# Patient Record
Sex: Male | Born: 1985 | Race: White | Hispanic: No | Marital: Married | Smoking: Never smoker
Health system: Southern US, Community
[De-identification: ages and names within clinical notes are randomized; demographics above are authoritative.]

## PROBLEM LIST (undated history)

## (undated) DIAGNOSIS — Z789 Other specified health status: Secondary | ICD-10-CM

## (undated) HISTORY — PX: WISDOM TOOTH EXTRACTION: SHX21

## (undated) HISTORY — DX: Other specified health status: Z78.9

---
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
## 2008-07-23 ENCOUNTER — Emergency Department (HOSPITAL_BASED_OUTPATIENT_CLINIC_OR_DEPARTMENT_OTHER): Admission: EM | Admit: 2008-07-23 | Discharge: 2008-07-23 | Payer: Self-pay | Admitting: Emergency Medicine

## 2009-03-28 ENCOUNTER — Emergency Department (HOSPITAL_COMMUNITY): Admission: EM | Admit: 2009-03-28 | Discharge: 2009-03-28 | Payer: Self-pay | Admitting: Emergency Medicine

## 2010-05-11 LAB — CBC
HCT: 41.4 % (ref 39.0–52.0)
Hemoglobin: 14.4 g/dL (ref 13.0–17.0)
MCHC: 34.7 g/dL (ref 30.0–36.0)
MCV: 90.2 fL (ref 78.0–100.0)
Platelets: 205 10*3/uL (ref 150–400)
RBC: 4.6 MIL/uL (ref 4.22–5.81)
RDW: 12.2 % (ref 11.5–15.5)
WBC: 10.4 10*3/uL (ref 4.0–10.5)

## 2010-05-11 LAB — URINALYSIS, ROUTINE W REFLEX MICROSCOPIC
Bilirubin Urine: NEGATIVE
Glucose, UA: NEGATIVE mg/dL
Hgb urine dipstick: NEGATIVE
Ketones, ur: NEGATIVE mg/dL
Nitrite: NEGATIVE
Protein, ur: NEGATIVE mg/dL
Specific Gravity, Urine: >1.03 — ABNORMAL HIGH (ref 1.005–1.030)
Urobilinogen, UA: 0.2 mg/dL (ref 0.0–1.0)
pH: 5 (ref 5.0–8.0)

## 2010-05-11 LAB — BASIC METABOLIC PANEL
BUN: 12 mg/dL (ref 6–23)
CO2: 31 mEq/L (ref 19–32)
Calcium: 9.2 mg/dL (ref 8.4–10.5)
Chloride: 99 mEq/L (ref 96–112)
Creatinine, Ser: 0.9 mg/dL (ref 0.4–1.5)
GFR calc Af Amer: >60 mL/min (ref >60)
GFR calc non Af Amer: >60 mL/min (ref >60)
Glucose, Bld: 112 mg/dL — ABNORMAL HIGH (ref 70–99)
Potassium: 3.6 mEq/L (ref 3.5–5.1)
Sodium: 135 mEq/L (ref 135–145)

## 2010-05-11 LAB — HEPATIC FUNCTION PANEL
AST: 21 U/L (ref 0–37)
Albumin: 4.2 g/dL (ref 3.5–5.2)
Total Bilirubin: 0.9 mg/dL (ref 0.3–1.2)
Total Protein: 7.2 g/dL (ref 6.0–8.3)

## 2010-05-11 LAB — DIFFERENTIAL
Basophils Absolute: 0 10*3/uL (ref 0.0–0.1)
Basophils Relative: 0 % (ref 0–1)
Monocytes Relative: 7 % (ref 3–12)
Neutro Abs: 7.9 10*3/uL — ABNORMAL HIGH (ref 1.7–7.7)
Neutrophils Relative %: 75 % (ref 43–77)

## 2010-05-29 LAB — WOUND CULTURE

## 2012-11-01 ENCOUNTER — Emergency Department (HOSPITAL_BASED_OUTPATIENT_CLINIC_OR_DEPARTMENT_OTHER)
Admission: EM | Admit: 2012-11-01 | Discharge: 2012-11-02 | Disposition: A | Discharge status: Home / Self Care | Payer: BC Managed Care – PPO | Attending: Emergency Medicine | Admitting: Emergency Medicine

## 2012-11-01 ENCOUNTER — Encounter (HOSPITAL_BASED_OUTPATIENT_CLINIC_OR_DEPARTMENT_OTHER): Payer: Self-pay | Admitting: Emergency Medicine

## 2012-11-01 DIAGNOSIS — J9811 Atelectasis: Secondary | ICD-10-CM

## 2012-11-01 DIAGNOSIS — M549 Dorsalgia, unspecified: Secondary | ICD-10-CM | POA: Insufficient documentation

## 2012-11-01 DIAGNOSIS — R091 Pleurisy: Secondary | ICD-10-CM

## 2012-11-01 DIAGNOSIS — J9819 Other pulmonary collapse: Secondary | ICD-10-CM | POA: Insufficient documentation

## 2012-11-01 NOTE — ED Notes (Signed)
Pt denies injury or event but has had L rib area pain x 1 week that has increased over the last 24 hrs pain increases with deep breathing or palpation denies SOB

## 2012-11-01 NOTE — ED Provider Notes (Addendum)
CSN: [id number]     Arrival date & time 11/01/12  2146 History   This chart was scribed for Loren Racer, MD by Ronal Fear, ED Scribe. This patient was seen in room MH08/MH08 and the patient's care was started at 11:54 PM.   Chief Complaint  Patient presents with  . Chest Pain   Patient is a 27 y.o. male presenting with chest pain. The history is provided by the patient. No language interpreter was used.  Chest Pain Associated symptoms: back pain   Associated symptoms: no abdominal pain, no cough, no dizziness, no fever, no headache, no nausea, no numbness, no palpitations, no shortness of breath, not vomiting and no weakness    this is a 26 year old male complaining of left lateral chest pain. The pain has been there for roughly one week but worse over the last 24 hours. Patient states the pain is worse with deep breathing. He is able to rotate the pain with palpation. Has had no injury to the area. He has no rashes site. He denies shortness of breath or cough. No sick contacts. No recent extended travel or surgeries. He denies lower studies pain or swelling. Pain radiates around into his back.  No past medical history on file. No past surgical history on file. No family history on file. History  Substance Use Topics  . Smoking status: Never Smoker   . Smokeless tobacco: Not on file  . Alcohol Use: No    Review of Systems  Constitutional: Negative for fever and chills.  HENT: Negative for sore throat.   Respiratory: Negative for cough and shortness of breath.   Cardiovascular: Positive for chest pain. Negative for palpitations and leg swelling.  Gastrointestinal: Negative for nausea, vomiting, abdominal pain and diarrhea.  Musculoskeletal: Positive for back pain. Negative for myalgias.  Skin: Negative for rash.  Neurological: Negative for dizziness, seizures, weakness, light-headedness, numbness and headaches.  All other systems reviewed and are negative.    Allergies   Penicillins  Home Medications  No current outpatient prescriptions on file. BP 155/90  Pulse 96  Temp(Src) 99.5 F (37.5 C) (Oral)  Resp 18  SpO2 97% Physical Exam  Nursing note and vitals reviewed. Constitutional: He is oriented to person, place, and time. He appears well-developed and well-nourished. No distress.  HENT:  Head: Normocephalic and atraumatic.  Mouth/Throat: Oropharynx is clear and moist.  Eyes: EOM are normal. Pupils are equal, round, and reactive to light.  Neck: Normal range of motion. Neck supple. No tracheal deviation present.  Cardiovascular: Normal rate and regular rhythm.  Exam reveals friction rub. Exam reveals no gallop.   No murmur heard. Pulmonary/Chest: Effort normal. No respiratory distress. He has no wheezes. He has rales (crackles in the left base). He exhibits no tenderness.  Abdominal: Soft. Bowel sounds are normal. He exhibits no distension and no mass. There is no tenderness. There is no rebound and no guarding.  Musculoskeletal: Normal range of motion. He exhibits no edema and no tenderness.  No calf swelling or tenderness  Neurological: He is alert and oriented to person, place, and time.  Patient is alert and oriented x3 with clear, goal oriented speech. Patient has 5/5 motor in all extremities. Sensation is intact to light touch. Patient has a normal gait and walks without assistance.   Skin: Skin is warm and dry. No rash noted. No erythema.  Psychiatric: He has a normal mood and affect. His behavior is normal.    ED Course  Procedures (including critical  care time)  DIAGNOSTIC STUDIES: Oxygen Saturation is 97% on RA, adeqaute by my interpretation.    COORDINATION OF CARE: 11:54 PM- Pt advised of plan for treatment and pt agrees.     Labs Review Labs Reviewed - No data to display Imaging Review No results found.  MDM  Patient with pleuritic left lateral chest pain. Patient is a low risk for PE. We'll screen with d-dimer. Also  on the differential is pleurisy and subclinical pneumonia.  CT with evidence of left greater than right atelectasis. No evidence of PE or pneumonia. Patient was placed on ibuprofen 3 times daily with meals and regular insulin spirometry use. Return precautions have been given.  Loren Racer, MD 11/02/12 [phone]  I personally performed the services described in this documentation, which was scribed in my presence. The recorded information has been reviewed and is accurate.    Loren Racer, MD 11/02/12 [phone]

## 2012-11-02 ENCOUNTER — Emergency Department (HOSPITAL_BASED_OUTPATIENT_CLINIC_OR_DEPARTMENT_OTHER): Payer: BC Managed Care – PPO

## 2012-11-02 LAB — BASIC METABOLIC PANEL
BUN: 12 mg/dL (ref 6–23)
Calcium: 10 mg/dL (ref 8.4–10.5)
GFR calc Af Amer: >90 mL/min (ref >90)
GFR calc non Af Amer: >90 mL/min (ref >90)
Potassium: 3.8 mEq/L (ref 3.5–5.1)
Sodium: 140 mEq/L (ref 135–145)

## 2012-11-02 LAB — CBC WITH DIFFERENTIAL/PLATELET
Basophils Relative: 0 % (ref 0–1)
Eosinophils Absolute: 0.3 10*3/uL (ref 0.0–0.7)
Eosinophils Relative: 3 % (ref 0–5)
MCH: 31 pg (ref 26.0–34.0)
MCHC: 34.3 g/dL (ref 30.0–36.0)
Neutrophils Relative %: 61 % (ref 43–77)
Platelets: 220 10*3/uL (ref 150–400)

## 2012-11-02 MED ORDER — IOHEXOL 350 MG/ML SOLN
80.0000 mL | Freq: Once | INTRAVENOUS | Status: AC | PRN
  Administered 2012-11-02: 80 mL via INTRAVENOUS

## 2012-11-02 MED ORDER — IBUPROFEN 600 MG PO TABS: 600.0000 mg | ORAL_TABLET | Freq: Four times a day (QID) | ORAL | Status: DC | PRN

## 2015-02-16 ENCOUNTER — Ambulatory Visit (INDEPENDENT_AMBULATORY_CARE_PROVIDER_SITE_OTHER): Payer: 59 | Admitting: Family Medicine

## 2015-02-16 ENCOUNTER — Encounter: Payer: Self-pay | Admitting: Family Medicine

## 2015-02-16 VITALS — BP 144/94 | HR 113 | Temp 98.3°F | Ht 69.0 in | Wt 251.0 lb

## 2015-02-16 DIAGNOSIS — J209 Acute bronchitis, unspecified: Secondary | ICD-10-CM | POA: Diagnosis not present

## 2015-02-16 MED ORDER — AZITHROMYCIN 250 MG PO TABS: ORAL_TABLET | ORAL | Status: DC

## 2015-02-16 MED ORDER — FLUTICASONE PROPIONATE 50 MCG/ACT NA SUSP: 1.0000 | Freq: Two times a day (BID) | NASAL | Status: DC | PRN

## 2015-02-16 NOTE — Progress Notes (Signed)
BP 144/94 mmHg  Pulse 113  Temp(Src) 98.3 F (36.8 C) (Oral)  Ht  (1.753 m)  Wt 251 lb (113.853 kg)  BMI 37.05 kg/m2   Subjective:    Patient ID: Dale Wiley, male    DOB: 01/09/1986, 29 y.o.   MRN: [id number]  HPI: Dale Wiley is a 29 y.o. male presenting on 02/16/2015 for Chest congestion and Cough   HPI Chest congestion and cough  Patient presents today as a new patient because he has not been here in over 3 years clinic with chest congestion and cough. This is been going on for about 1 week. He's been using DayQuil and NyQuil and Sudafed and they helped some but not completely. He denies any fevers or chills but mainly just has this congestion down in his chest that he is having trouble clearing. He also complains of sinus congestion and postnasal drainage that is worse at night causing him to cough more at nighttime. His wife has been ill with pneumonia and he had a sister that had strep throat as well. He also complains of a sore in ulcer in the back of his throat and ears tonsils.  Relevant past medical, surgical, family and social history reviewed and updated as indicated. Interim medical history since our last visit reviewed. Allergies and medications reviewed and updated.  Review of Systems  Constitutional: Negative for fever and chills.  HENT: Positive for congestion, postnasal drip, rhinorrhea, sinus pressure and sore throat. Negative for ear discharge, ear pain, sneezing and voice change.   Eyes: Negative for pain, discharge, redness and visual disturbance.  Respiratory: Positive for cough. Negative for chest tightness, shortness of breath and wheezing.   Cardiovascular: Negative for chest pain and leg swelling.  Gastrointestinal: Negative for abdominal pain, diarrhea and constipation.  Genitourinary: Negative for difficulty urinating.  Musculoskeletal: Negative for back pain and gait problem.  Skin: Negative for rash.  Neurological: Negative for syncope,  light-headedness and headaches.  All other systems reviewed and are negative.   Per HPI unless specifically indicated above  Social History   Social History  . Marital Status: Married    Spouse Name: N/A  . Number of Children: N/A  . Years of Education: N/A   Occupational History  . Not on file.   Social History Main Topics  . Smoking status: Never Smoker   . Smokeless tobacco: Never Used  . Alcohol Use: Yes     Comment: occasionally  . Drug Use: No  . Sexual Activity: Yes     Comment: married since 2013   Other Topics Concern  . Not on file   Social History Narrative    Past Surgical History  Procedure Laterality Date  . Wisdom tooth extraction      Family History  Problem Relation Age of Onset  . Hypertension Mother   . Pancreatitis Mother   . Hypertension Father   . Hyperlipidemia Father   . Hypertension Maternal Grandfather       Medication List       This list is accurate as of: 02/16/15  4:25 PM.  Always use your most recent med list.               azithromycin 250 MG tablet  Commonly known as:  ZITHROMAX  Take 2 the first day and then one each day after.     DAYQUIL MULTI-SYMPTOM COLD/FLU PO  Take by mouth.     fluticasone 50 MCG/ACT nasal spray  Commonly known  as:  FLONASE  Place 1 spray into both nostrils 2 (two) times daily as needed for allergies or rhinitis.     NYQUIL MULTI-SYMPTOM PO  Take by mouth.           Objective:    BP 144/94 mmHg  Pulse 113  Temp(Src) 98.3 F (36.8 C) (Oral)  Ht 5\' 9"  (1.753 m)  Wt 251 lb (113.853 kg)  BMI 37.05 kg/m2  Wt Readings from Last 3 Encounters:  02/16/15 251 lb (113.853 kg)    Physical Exam  Constitutional: He is oriented to person, place, and time. He appears well-developed and well-nourished. No distress.  HENT:  Right Ear: Tympanic membrane, external ear and ear canal normal.  Left Ear: Tympanic membrane, external ear and ear canal normal.  Nose: Mucosal edema and rhinorrhea  present. No sinus tenderness. No epistaxis. Right sinus exhibits maxillary sinus tenderness. Right sinus exhibits no frontal sinus tenderness. Left sinus exhibits maxillary sinus tenderness. Left sinus exhibits no frontal sinus tenderness.  Mouth/Throat: Uvula is midline and mucous membranes are normal. Posterior oropharyngeal edema and posterior oropharyngeal erythema present. No oropharyngeal exudate or tonsillar abscesses.    Eyes: Conjunctivae and EOM are normal. Pupils are equal, round, and reactive to light. Right eye exhibits no discharge. No scleral icterus.  Cardiovascular: Normal rate, regular rhythm, normal heart sounds and intact distal pulses.   No murmur heard. Pulmonary/Chest: Effort normal and breath sounds normal. No respiratory distress. He has no wheezes.  Abdominal: He exhibits no distension.  Musculoskeletal: Normal range of motion. He exhibits no edema.  Neurological: He is alert and oriented to person, place, and time. Coordination normal.  Skin: Skin is warm and dry. No rash noted. He is not diaphoretic.  Psychiatric: He has a normal mood and affect. His behavior is normal.  Vitals reviewed.      Assessment & Plan:   Problem List Items Addressed This Visit    None    Visit Diagnoses    Acute bronchitis, unspecified organism    -  Primary    Relevant Medications    azithromycin (ZITHROMAX) 250 MG tablet    fluticasone (FLONASE) 50 MCG/ACT nasal spray        Follow up plan: Return in about 4 weeks (around 03/16/2015), or if symptoms worsen or fail to improve, for elevated BP.  Arville CareJoshua Londa Mackowski, MD [facility] 02/16/2015, 4:25 PM

## 2015-03-01 ENCOUNTER — Emergency Department (HOSPITAL_BASED_OUTPATIENT_CLINIC_OR_DEPARTMENT_OTHER)
Admission: EM | Admit: 2015-03-01 | Discharge: 2015-03-01 | Disposition: A | Discharge status: Home / Self Care | Payer: 59 | Attending: Emergency Medicine | Admitting: Emergency Medicine

## 2015-03-01 ENCOUNTER — Emergency Department (HOSPITAL_BASED_OUTPATIENT_CLINIC_OR_DEPARTMENT_OTHER): Payer: 59

## 2015-03-01 ENCOUNTER — Encounter (HOSPITAL_BASED_OUTPATIENT_CLINIC_OR_DEPARTMENT_OTHER): Payer: Self-pay | Admitting: *Deleted

## 2015-03-01 DIAGNOSIS — Z7951 Long term (current) use of inhaled steroids: Secondary | ICD-10-CM | POA: Insufficient documentation

## 2015-03-01 DIAGNOSIS — R42 Dizziness and giddiness: Secondary | ICD-10-CM | POA: Diagnosis not present

## 2015-03-01 DIAGNOSIS — R0602 Shortness of breath: Secondary | ICD-10-CM | POA: Insufficient documentation

## 2015-03-01 DIAGNOSIS — R0789 Other chest pain: Secondary | ICD-10-CM | POA: Insufficient documentation

## 2015-03-01 DIAGNOSIS — Z79899 Other long term (current) drug therapy: Secondary | ICD-10-CM | POA: Insufficient documentation

## 2015-03-01 DIAGNOSIS — Z880 Allergy status to penicillin: Secondary | ICD-10-CM | POA: Diagnosis not present

## 2015-03-01 DIAGNOSIS — R002 Palpitations: Secondary | ICD-10-CM | POA: Diagnosis present

## 2015-03-01 LAB — BASIC METABOLIC PANEL
ANION GAP: 6 (ref 5–15)
BUN: 12 mg/dL (ref 6–20)
CALCIUM: 9.3 mg/dL (ref 8.9–10.3)
CHLORIDE: 103 mmol/L (ref 101–111)
CO2: 27 mmol/L (ref 22–32)
CREATININE: 0.98 mg/dL (ref 0.61–1.24)
GFR calc non Af Amer: >60 mL/min (ref >60)
Glucose, Bld: 103 mg/dL — ABNORMAL HIGH (ref 65–99)
Potassium: 3.3 mmol/L — ABNORMAL LOW (ref 3.5–5.1)
SODIUM: 136 mmol/L (ref 135–145)

## 2015-03-01 LAB — CBC
HCT: 43.7 % (ref 39.0–52.0)
HEMOGLOBIN: 14.6 g/dL (ref 13.0–17.0)
MCH: 30.4 pg (ref 26.0–34.0)
MCHC: 33.4 g/dL (ref 30.0–36.0)
MCV: 90.9 fL (ref 78.0–100.0)
PLATELETS: 290 10*3/uL (ref 150–400)
RBC: 4.81 MIL/uL (ref 4.22–5.81)
RDW: 12.4 % (ref 11.5–15.5)
WBC: 7.3 10*3/uL (ref 4.0–10.5)

## 2015-03-01 LAB — TROPONIN I

## 2015-03-01 MED ORDER — ALBUTEROL SULFATE HFA 108 (90 BASE) MCG/ACT IN AERS: 2.0000 | INHALATION_SPRAY | RESPIRATORY_TRACT | Status: DC | PRN

## 2015-03-01 NOTE — ED Notes (Signed)
Patient transported to X-ray 

## 2015-03-01 NOTE — ED Notes (Signed)
Pt c/o " heart fluttering" and SOB  x 1 hr ago off and on x 8 months

## 2015-03-01 NOTE — Discharge Instructions (Signed)
You were evaluated in the ED today for your chest discomfort. There does not appear to be an emergent cause for your symptoms at this time. Your exam was reassuring as were your labs, EKG, chest x-ray. Please follow-up with your doctor in the next 2-3 days for reevaluation. He may use your prescribed inhaler to see if this helps with your discomfort. Return to ED for any new or worsening symptoms as we discussed.  Nonspecific Chest Pain  Chest pain can be caused by many different conditions. There is always a chance that your pain could be related to something serious, such as a heart attack or a blood clot in your lungs. Chest pain can also be caused by conditions that are not life-threatening. If you have chest pain, it is very important to follow up with your health care provider. CAUSES  Chest pain can be caused by:  Heartburn.  Pneumonia or bronchitis.  Anxiety or stress.  Inflammation around your heart (pericarditis) or lung (pleuritis or pleurisy).  A blood clot in your lung.  A collapsed lung (pneumothorax). It can develop suddenly on its own (spontaneous pneumothorax) or from trauma to the chest.  Shingles infection (varicella-zoster virus).  Heart attack.  Damage to the bones, muscles, and cartilage that make up your chest wall. This can include:  Bruised bones due to injury.  Strained muscles or cartilage due to frequent or repeated coughing or overwork.  Fracture to one or more ribs.  Sore cartilage due to inflammation (costochondritis). RISK FACTORS  Risk factors for chest pain may include:  Activities that increase your risk for trauma or injury to your chest.  Respiratory infections or conditions that cause frequent coughing.  Medical conditions or overeating that can cause heartburn.  Heart disease or family history of heart disease.  Conditions or health behaviors that increase your risk of developing a blood clot.  Having had chicken pox (varicella  zoster). SIGNS AND SYMPTOMS Chest pain can feel like:  Burning or tingling on the surface of your chest or deep in your chest.  Crushing, pressure, aching, or squeezing pain.  Dull or sharp pain that is worse when you move, cough, or take a deep breath.  Pain that is also felt in your back, neck, shoulder, or arm, or pain that spreads to any of these areas. Your chest pain may come and go, or it may stay constant. DIAGNOSIS Lab tests or other studies may be needed to find the cause of your pain. Your health care provider may have you take a test called an ambulatory ECG (electrocardiogram). An ECG records your heartbeat patterns at the time the test is performed. You may also have other tests, such as:  Transthoracic echocardiogram (TTE). During echocardiography, sound waves are used to create a picture of all of the heart structures and to look at how blood flows through your heart.  Transesophageal echocardiogram (TEE).This is a more advanced imaging test that obtains images from inside your body. It allows your health care provider to see your heart in finer detail.  Cardiac monitoring. This allows your health care provider to monitor your heart rate and rhythm in real time.  Holter monitor. This is a portable device that records your heartbeat and can help to diagnose abnormal heartbeats. It allows your health care provider to track your heart activity for several days, if needed.  Stress tests. These can be done through exercise or by taking medicine that makes your heart beat more quickly.  Blood tests.  Imaging tests. TREATMENT  Your treatment depends on what is causing your chest pain. Treatment may include:  Medicines. These may include:  Acid blockers for heartburn.  Anti-inflammatory medicine.  Pain medicine for inflammatory conditions.  Antibiotic medicine, if an infection is present.  Medicines to dissolve blood clots.  Medicines to treat coronary artery  disease.  Supportive care for conditions that do not require medicines. This may include:  Resting.  Applying heat or cold packs to injured areas.  Limiting activities until pain decreases. HOME CARE INSTRUCTIONS  If you were prescribed an antibiotic medicine, finish it all even if you start to feel better.  Avoid any activities that bring on chest pain.  Do not use any tobacco products, including cigarettes, chewing tobacco, or electronic cigarettes. If you need help quitting, ask your health care provider.  Do not drink alcohol.  Take medicines only as directed by your health care provider.  Keep all follow-up visits as directed by your health care provider. This is important. This includes any further testing if your chest pain does not go away.  If heartburn is the cause for your chest pain, you may be told to keep your head raised (elevated) while sleeping. This reduces the chance that acid will go from your stomach into your esophagus.  Make lifestyle changes as directed by your health care provider. These may include:  Getting regular exercise. Ask your health care provider to suggest some activities that are safe for you.  Eating a heart-healthy diet. A registered dietitian can help you to learn healthy eating options.  Maintaining a healthy weight.  Managing diabetes, if necessary.  Reducing stress. SEEK MEDICAL CARE IF:  Your chest pain does not go away after treatment.  You have a rash with blisters on your chest.  You have a fever. SEEK IMMEDIATE MEDICAL CARE IF:   Your chest pain is worse.  You have an increasing cough, or you cough up blood.  You have severe abdominal pain.  You have severe weakness.  You faint.  You have chills.  You have sudden, unexplained chest discomfort.  You have sudden, unexplained discomfort in your arms, back, neck, or jaw.  You have shortness of breath at any time.  You suddenly start to sweat, or your skin gets  clammy.  You feel nauseous or you vomit.  You suddenly feel light-headed or dizzy.  Your heart begins to beat quickly, or it feels like it is skipping beats. These symptoms may represent a serious problem that is an emergency. Do not wait to see if the symptoms will go away. Get medical help right away. Call your local emergency services (911 in the [place] Do not drive yourself to the hospital.   This information is not intended to replace advice given to you by your health care provider. Make sure you discuss any questions you have with your health care provider.   Document Released: 11/15/2004 Document Revised: 02/26/2014 Document Reviewed: 09/11/2013 Elsevier Interactive Patient Education [employer]2016 Elsevier Inc.

## 2015-03-01 NOTE — ED Provider Notes (Signed)
CSN: [id number]     Arrival date & time 03/01/15  1919 History  By signing my name below, I, Phillis Haggis, attest that this documentation has been prepared under the direction and in the presence of [employer], PA-C. Electronically Signed: Phillis Haggis, ED Scribe. 03/01/2015. 9:05 PM.   Chief Complaint  Patient presents with  . Palpitations   The history is provided by the patient. No language interpreter was used.  HPI Comments: Dale Wiley is a 30 y.o. male who presents to the Emergency Department complaining of constant, gradually worsening palpitations and SOB onset one hour ago. Pt reports associated chest tightness and pressure and lightheadedness. He states that he was taking out his dogs when the symptoms started. He reports that he has had these symptoms occasionally over the past 8 months, but it has not been this constant before. Reports he is very active and his symptoms have never been exertional. Pt has recently been treated with a z-pack for URI symptoms, but has since finished the course. Pt denies fever, diaphoresis, hemoptysis, leg swelling, nausea, or vomiting. He denies hx of smoking, drug use, alcohol use, or recent long travel. He reports family hx of HTN and heart disease.   History reviewed. No pertinent past medical history. Past Surgical History  Procedure Laterality Date  . Wisdom tooth extraction     Family History  Problem Relation Age of Onset  . Hypertension Mother   . Pancreatitis Mother   . Hypertension Father   . Hyperlipidemia Father   . Hypertension Maternal Grandfather    Social History  Substance Use Topics  . Smoking status: Never Smoker   . Smokeless tobacco: Never Used  . Alcohol Use: Yes     Comment: occasionally    Review of Systems  Constitutional: Negative for fever and diaphoresis.  Respiratory: Positive for chest tightness and shortness of breath.   Cardiovascular: Positive for palpitations. Negative for leg swelling.   Gastrointestinal: Negative for nausea and vomiting.  Neurological: Positive for light-headedness.  All other systems reviewed and are negative.  Allergies  Penicillins  Home Medications   Prior to Admission medications   Medication Sig Start Date End Date Taking? Authorizing Provider  albuterol (PROVENTIL HFA;VENTOLIN HFA) 108 (90 Base) MCG/ACT inhaler Inhale 2 puffs into the lungs every 2 (two) hours as needed for wheezing or shortness of breath (cough). 03/01/15   Joycie Peek, PA-C  fluticasone (FLONASE) 50 MCG/ACT nasal spray Place 1 spray into both nostrils 2 (two) times daily as needed for allergies or rhinitis. 02/16/15   Elige Radon Dettinger, MD  Pseudoeph-Doxylamine-DM-APAP (NYQUIL MULTI-SYMPTOM PO) Take by mouth.    Historical Provider, MD  Pseudoephedrine-APAP-DM (DAYQUIL MULTI-SYMPTOM COLD/FLU PO) Take by mouth.    Historical Provider, MD   BP 126/91 mmHg  Pulse 93  Temp(Src) 97.5 F (36.4 C) (Oral)  Resp 24  Ht 5\' 9"  (1.753 m)  Wt 108.863 kg  BMI 35.43 kg/m2  SpO2 97% Physical Exam  Constitutional: He is oriented to person, place, and time. He appears well-developed and well-nourished. No distress.  HENT:  Head: Normocephalic and atraumatic.  Mouth/Throat: Oropharynx is clear and moist.  Eyes: Conjunctivae and EOM are normal. Pupils are equal, round, and reactive to light. Right eye exhibits no discharge. Left eye exhibits no discharge. No scleral icterus.  Neck: Normal range of motion. Neck supple.  Cardiovascular: Normal rate, regular rhythm and normal heart sounds.   Pulmonary/Chest: Effort normal and breath sounds normal. No respiratory distress. He has no  wheezes. He has no rales.  Abdominal: Soft. There is no tenderness.  Musculoskeletal: Normal range of motion. He exhibits no edema or tenderness.  Neurological: He is alert and oriented to person, place, and time.  Cranial Nerves II-XII grossly intact  Skin: Skin is warm and dry. No rash noted. He is not  diaphoretic.  Psychiatric: He has a normal mood and affect. His behavior is normal.  Nursing note and vitals reviewed.   ED Course  Procedures (including critical care time) DIAGNOSTIC STUDIES: Oxygen Saturation is 100% on RA, normal by my interpretation.    COORDINATION OF CARE: 8:31 PM-Discussed treatment plan which includes labs and x-ray with pt at bedside and pt agreed to plan.    Labs Review Labs Reviewed  BASIC METABOLIC PANEL - Abnormal; Notable for the following:    Potassium 3.3 (*)    Glucose, Bld 103 (*)    All other components within normal limits  CBC  TROPONIN I    Imaging Review Dg Chest 2 View  03/01/2015  CLINICAL DATA:  Chest pain and heart palpitations. EXAM: CHEST  2 VIEW COMPARISON:  11/02/2012. FINDINGS: The heart size and mediastinal contours are within normal limits. Both lungs are clear. The visualized skeletal structures are unremarkable. IMPRESSION: No active cardiopulmonary disease. Electronically Signed   By: Signa Kell M.D.   On: 03/01/2015 20:59   I have personally reviewed and evaluated these images and lab results as part of my medical decision-making.   EKG Interpretation   Date/Time:  Tuesday March 01 2015 19:32:57 EST Ventricular Rate:  97 PR Interval:  158 QRS Duration: 84 QT Interval:  346 QTC Calculation: 439 R Axis:   39 Text Interpretation:  Normal sinus rhythm Normal ECG ED PHYSICIAN  INTERPRETATION AVAILABLE IN [facility] Confirmed by TEST, Record  ([id number]) on 03/02/2015 6:49:18 AM     Meds given in ED:  Medications - No data to display  Discharge Medication List as of 03/01/2015  9:35 PM    START taking these medications   Details  albuterol (PROVENTIL HFA;VENTOLIN HFA) 108 (90 Base) MCG/ACT inhaler Inhale 2 puffs into the lungs every 2 (two) hours as needed for wheezing or shortness of breath (cough)., Starting 03/01/2015, Until Discontinued, Print       Filed Vitals:   03/01/15 1926 03/01/15 2100 03/01/15  2145  BP: 153/103 120/84 126/91  Pulse: 84 84 93  Temp: 97.5 F (36.4 C)    TempSrc: Oral    Resp: 18 13 24   Height: 5\' 9"  (1.753 m)    Weight: 108.863 kg    SpO2: 100% 97% 97%    MDM  Vitals stable - WNL -afebrile Pt resting comfortably in ED. No chest discomfort in ED PE--Lung exam benign. Cardiac auscultation reveals no murmurs rubs or gallops. Grossly Benign Physical Exam Labwork: Troponin negative. EKG reassuring. Labs otherwise noncontributory Imaging: CXR shows no acute cardiopulmonary pathology.  DDX: Patient with atypical chest discomfort ongoing intermittently over the past 8 months. Discomfort is not exertional. No discomfort in the ED. Sounds like bronchospasm. We'll give inhaler to see if that helps. Clinical picture and exam today not consistent with ACS/dissection. Heart score 1. No evidence of spontaneous pneumothorax, esophageal rupture or other mediastinitis. PERC negative, doubt PE. No evidence of myocarditis, endocarditis, pericarditis.   I discussed all relevant lab findings and imaging results with pt and they verbalized understanding. Discussed f/u with PCP within 48 hrs and return precautions, pt very amenable to plan.   Final  diagnoses:  Atypical chest pain    I personally performed the services described in this documentation, which was scribed in my presence. The recorded information has been reviewed and is accurate.    Joycie PeekBenjamin Deanna Wiater, PA-C 03/02/15 1639  Tilden FossaElizabeth Rees, MD 03/07/15 1556

## 2016-08-13 ENCOUNTER — Ambulatory Visit (INDEPENDENT_AMBULATORY_CARE_PROVIDER_SITE_OTHER): Payer: 59 | Admitting: Family Medicine

## 2016-08-13 ENCOUNTER — Ambulatory Visit (INDEPENDENT_AMBULATORY_CARE_PROVIDER_SITE_OTHER): Payer: 59

## 2016-08-13 ENCOUNTER — Encounter: Payer: Self-pay | Admitting: Family Medicine

## 2016-08-13 VITALS — BP 150/97 | HR 94 | Temp 97.3°F | Ht 69.0 in | Wt 212.4 lb

## 2016-08-13 DIAGNOSIS — R109 Unspecified abdominal pain: Secondary | ICD-10-CM

## 2016-08-13 LAB — URINALYSIS, COMPLETE
BILIRUBIN UA: NEGATIVE
Glucose, UA: NEGATIVE
Leukocytes, UA: NEGATIVE
NITRITE UA: NEGATIVE
Protein, UA: NEGATIVE
RBC UA: NEGATIVE
SPEC GRAV UA: 1.015 (ref 1.005–1.030)
UUROB: 0.2 mg/dL (ref 0.2–1.0)
pH, UA: 6.5 (ref 5.0–7.5)

## 2016-08-13 LAB — MICROSCOPIC EXAMINATION
Bacteria, UA: NONE SEEN
EPITHELIAL CELLS (NON RENAL): NONE SEEN /HPF (ref 0–10)
RBC, UA: NONE SEEN /hpf (ref 0–?)
RENAL EPITHEL UA: NONE SEEN /HPF
WBC UA: NONE SEEN /HPF (ref 0–?)

## 2016-08-13 NOTE — Progress Notes (Signed)
   HPI  Patient presents today here with flank pain.  Patient explains he has had 2 days left flank pain, it started in his left upper back, and slowly began to progressive colicky type pain radiating to his left groin. This is similar to previous renal stone which passed without severe pain. Patient denies any hematuria. He denies any injury. He denies any fever, chills, sweats. He is tolerating food and fluids occasional.  Patient states pain at baseline is 1-2 out of 10, it comes up to a 5 out of 10 at its most severe, at the most severe it is sharp and shooting from left flank to left groin.  PMH: Smoking status noted ROS: Per HPI  Objective: BP (!) 150/97   Pulse 94   Temp 97.3 F (36.3 C) (Oral)   Ht 5\' 9"  (1.753 m)   Wt 212 lb 6.4 oz (96.3 kg)   BMI 31.37 kg/m  Gen: NAD, alert, cooperative with exam HEENT: NCAT CV: RRR, good S1/S2, no murmur Resp: CTABL, no wheezes, non-labored Abd: SNTND, BS present, no guarding or organomegaly, no CVA tenderness Ext: No edema, warm Neuro: Alert and oriented, No gross deficits MSK No tenderness to palpation of bilateral lumbar paraspinal muscles or midline spine.   Urinalysis positive for ketones, negative for nitrites or leukocytes. X-ray shows moderate stool burden, no apparent stones   Assessment and plan:  # Flank pain Patient's workup is not concerning for renal stones at this time with no microscopic hematuria. After our discussion follow-up after x-ray and urinalysis he feels that likely the biggest difference lately has been his stooling habits. He has been on a ketogenic diet and states that he's been having one BM about every 2-3 days as compared to 1-2 times daily previous to this. Start Metamucil Low threshold for follow-up, discussed multiple red flags including fever, sudden worsening abdominal pain, vomiting, diarrhea, hematochezia, or any other unexplained symptoms.      Orders Placed This Encounter    Procedures  . DG Abd 2 Views    Standing Status:   Future    Number of Occurrences:   1    Standing Expiration Date:   08/13/2017    Order Specific Question:   Reason for Exam (SYMPTOM  OR DIAGNOSIS REQUIRED)    Answer:   L flank pain, eval for renal stone.    Order Specific Question:   Preferred imaging location?    Answer:   Internal  . Urinalysis, Complete    No orders of the defined types were placed in this encounter.   Murtis SinkSam Bradshaw, MD [facility] 08/13/2016, 11:50 AM

## 2017-04-02 ENCOUNTER — Ambulatory Visit: Payer: 59 | Admitting: Family Medicine

## 2017-04-02 ENCOUNTER — Encounter: Payer: Self-pay | Admitting: Family Medicine

## 2017-04-02 VITALS — BP 125/76 | HR 106 | Temp 101.3°F | Ht 69.0 in | Wt 208.0 lb

## 2017-04-02 DIAGNOSIS — R509 Fever, unspecified: Secondary | ICD-10-CM

## 2017-04-02 DIAGNOSIS — J111 Influenza due to unidentified influenza virus with other respiratory manifestations: Secondary | ICD-10-CM

## 2017-04-02 LAB — VERITOR FLU A/B WAIVED
INFLUENZA B: NEGATIVE
Influenza A: POSITIVE — AB

## 2017-04-02 MED ORDER — OSELTAMIVIR PHOSPHATE 75 MG PO CAPS: 75.0000 mg | ORAL_CAPSULE | Freq: Two times a day (BID) | ORAL | 0 refills | Status: DC

## 2017-04-02 NOTE — Progress Notes (Signed)
Chief Complaint  Patient presents with  . Generalized Body Aches    pt here today c/o fever of 101 and body aches all over since yesterday    HPI  Patient presents today for patient presents with profuse dry cough runny stuffy nose. Diffuse headache of moderate intensity. Patient also has chills and subjective fever. Body aches worst in the back but present in the legs, shoulders, and torso as well most of the major joints. Has sapped the energy . Onset yesterday  PMH: Smoking status noted ROS: Per HPI  Objective: BP 125/76   Pulse (!) 106   Temp (!) 101.3 F (38.5 C) (Oral)   Ht 5\' 9"  (1.753 m)   Wt 208 lb (94.3 kg)   BMI 30.72 kg/m  Gen: NAD, alert, cooperative with exam HEENT: NCAT, EOMI, PERRL CV: RRR, good S1/S2, no murmur Resp: CTABL, no wheezes, non-labored Abd: SNTND, BS present, no guarding or organomegaly Ext: No edema, warm Neuro: Alert and oriented, No gross deficits  Assessment and plan:  1. Influenza with respiratory manifestation   2. Fever, unspecified fever cause     Meds ordered this encounter  Medications  . oseltamivir (TAMIFLU) 75 MG capsule    Sig: Take 1 capsule (75 mg total) by mouth 2 (two) times daily.    Dispense:  10 capsule    Refill:  0    Orders Placed This Encounter  Procedures  . Veritor Flu A/B Waived    Order Specific Question:   Source    Answer:   nasal    Follow up as needed.  Mechele ClaudeWarren Shanti Eichel, MD

## 2018-05-12 ENCOUNTER — Telehealth (INDEPENDENT_AMBULATORY_CARE_PROVIDER_SITE_OTHER): Payer: 59 | Admitting: Family Medicine

## 2018-05-12 ENCOUNTER — Other Ambulatory Visit: Payer: Self-pay

## 2018-05-12 DIAGNOSIS — J0110 Acute frontal sinusitis, unspecified: Secondary | ICD-10-CM

## 2018-05-12 NOTE — Progress Notes (Signed)
Virtual Visit via telephone Note  I connected with Dale Wiley on 05/12/18 at 1039 by telephone and verified that I am speaking with the correct person using two identifiers. Dale Wiley is currently located at home and No people are currently with her during visit. The provider, Elige Radon Aquan Kope, MD is located in their office at time of visit.  Call ended at 1046  I discussed the limitations, risks, security and privacy concerns of performing an evaluation and management service by telephone and the availability of in person appointments. I also discussed with the patient that there may be a patient responsible charge related to this service. The patient expressed understanding and agreed to proceed.   History and Present Illness: Head congestion and sinuses and nasal pressure and nasal congestion.  He says he does not feel normally bad enough that he was given necessarily correspond with the coronavirus is been sent home from work and that is why is in today.  He denies any sick contacts that he knows of or denies and any travel or exposure.  We instructed him that there are no testing sites available currently because of the overburden of the testing and the best thing for him to do currently is to stay home and self isolate., using mucinex and alka-seltzer and increase fluids No known exposure or travel, and works at [employer] and no known covid contacts. No diagnosis found.  Outpatient Encounter Medications as of 05/12/2018  Medication Sig  . oseltamivir (TAMIFLU) 75 MG capsule Take 1 capsule (75 mg total) by mouth 2 (two) times daily.   No facility-administered encounter medications on file as of 05/12/2018.     Review of Systems  Constitutional: Negative for chills and fever.  HENT: Positive for congestion, postnasal drip, rhinorrhea, sinus pressure, sneezing and sore throat. Negative for ear discharge, ear pain and voice change.   Eyes: Negative for pain, discharge,  redness and visual disturbance.  Respiratory: Positive for cough. Negative for shortness of breath and wheezing.   Cardiovascular: Negative for chest pain and leg swelling.  Musculoskeletal: Negative for gait problem.  Skin: Negative for rash.  All other systems reviewed and are negative.   Observations/Objective: Review of Systems  Constitutional: Negative for chills and fever.  HENT: Positive for congestion, postnasal drip, rhinorrhea, sinus pressure, sneezing and sore throat. Negative for ear discharge, ear pain and voice change.   Eyes: Negative for pain, discharge, redness and visual disturbance.  Respiratory: Positive for cough. Negative for shortness of breath and wheezing.   Cardiovascular: Negative for chest pain and leg swelling.  Musculoskeletal: Negative for gait problem.  Skin: Negative for rash.  All other systems reviewed and are negative.    Assessment and Plan: Problem List Items Addressed This Visit    None    Visit Diagnoses    Acute non-recurrent frontal sinusitis    -  Primary      He is using Alka-Seltzer and feels like he is doing fine on it but mainly is calling because of the coronavirus and wondering about testing or if he needs to be off for 2 weeks, he was instructed this point to be off for 2 weeks and that if any symptoms worsen he should call back or go to the emergency department but for now he should self quarantine in place. Follow Up Instructions:  Use the Mucinex and Alka-Seltzer and Tylenol ibuprofen as needed   I discussed the assessment and treatment plan with the patient. The patient  was provided an opportunity to ask questions and all were answered. The patient agreed with the plan and demonstrated an understanding of the instructions.   The patient was advised to call back or seek an in-person evaluation if the symptoms worsen or if the condition fails to improve as anticipated.  The above assessment and management plan was discussed with  the patient. The patient verbalized understanding of and has agreed to the management plan. Patient is aware to call the clinic if symptoms persist or worsen. Patient is aware when to return to the clinic for a follow-up visit. Patient educated on when it is appropriate to go to the emergency department.    I provided 7 minutes of non-face-to-face time during this encounter.    Nils Pyle, MD

## 2018-12-12 ENCOUNTER — Other Ambulatory Visit: Payer: Self-pay

## 2018-12-12 DIAGNOSIS — Z20822 Contact with and (suspected) exposure to covid-19: Secondary | ICD-10-CM

## 2018-12-14 ENCOUNTER — Telehealth: Payer: Self-pay

## 2018-12-14 LAB — NOVEL CORONAVIRUS, NAA: SARS-CoV-2, NAA: NOT DETECTED

## 2018-12-14 NOTE — Telephone Encounter (Signed)
Pt's wife called to check on covid results- advised results are not back.

## 2019-04-03 ENCOUNTER — Ambulatory Visit: Payer: 59 | Admitting: Physician Assistant

## 2019-04-03 ENCOUNTER — Other Ambulatory Visit: Payer: Self-pay

## 2019-04-03 ENCOUNTER — Ambulatory Visit (INDEPENDENT_AMBULATORY_CARE_PROVIDER_SITE_OTHER): Payer: 59

## 2019-04-03 ENCOUNTER — Encounter: Payer: Self-pay | Admitting: Physician Assistant

## 2019-04-03 VITALS — BP 149/90 | HR 78 | Temp 97.0°F | Ht 69.0 in | Wt 227.0 lb

## 2019-04-03 DIAGNOSIS — M25511 Pain in right shoulder: Secondary | ICD-10-CM | POA: Diagnosis not present

## 2019-04-03 MED ORDER — NAPROXEN 500 MG PO TABS: 500.0000 mg | ORAL_TABLET | Freq: Two times a day (BID) | ORAL | 1 refills | Status: DC

## 2019-04-03 NOTE — Progress Notes (Signed)
Acute Office Visit  Subjective:    Patient ID: Dale Wiley, male    DOB: 09-Jul-1985, 34 y.o.   MRN: [id number]  Chief Complaint  Patient presents with  . Shoulder Pain    Shoulder Pain  The pain is present in the right shoulder. This is a new problem. The current episode started yesterday. There has been a history of trauma. The problem occurs constantly. The problem has been unchanged. Pertinent negatives include no numbness.     No past medical history on file.  Past Surgical History:  Procedure Laterality Date  . WISDOM TOOTH EXTRACTION      Family History  Problem Relation Age of Onset  . Hypertension Mother   . Pancreatitis Mother   . Hypertension Father   . Hyperlipidemia Father   . Hypertension Maternal Grandfather     Social History   Socioeconomic History  . Marital status: Married    Spouse name: Not on file  . Number of children: Not on file  . Years of education: Not on file  . Highest education level: Not on file  Occupational History  . Not on file  Tobacco Use  . Smoking status: Never Smoker  . Smokeless tobacco: Never Used  Substance and Sexual Activity  . Alcohol use: Yes    Comment: occasionally  . Drug use: No  . Sexual activity: Yes    Comment: married since 2013  Other Topics Concern  . Not on file  Social History Narrative  . Not on file   Social Determinants of Health   Financial Resource Strain:   . Difficulty of Paying Living Expenses: Not on file  Food Insecurity:   . Worried About Charity fundraiser in the Last Year: Not on file  . Ran Out of Food in the Last Year: Not on file  Transportation Needs:   . Lack of Transportation (Medical): Not on file  . Lack of Transportation (Non-Medical): Not on file  Physical Activity:   . Days of Exercise per Week: Not on file  . Minutes of Exercise per Session: Not on file  Stress:   . Feeling of Stress : Not on file  Social Connections:   . Frequency of Communication with  Friends and Family: Not on file  . Frequency of Social Gatherings with Friends and Family: Not on file  . Attends Religious Services: Not on file  . Active Member of Clubs or Organizations: Not on file  . Attends Archivist Meetings: Not on file  . Marital Status: Not on file  Intimate Partner Violence:   . Fear of Current or Ex-Partner: Not on file  . Emotionally Abused: Not on file  . Physically Abused: Not on file  . Sexually Abused: Not on file    No outpatient medications prior to visit.   No facility-administered medications prior to visit.    Allergies  Allergen Reactions  . Penicillins   . Amoxicillin Rash    Review of Systems  Constitutional: Negative.  Negative for appetite change and fatigue.  Eyes: Negative for pain and visual disturbance.  Respiratory: Negative.  Negative for cough, chest tightness, shortness of breath and wheezing.   Cardiovascular: Negative.  Negative for chest pain, palpitations and leg swelling.  Gastrointestinal: Negative.  Negative for abdominal pain, diarrhea, nausea and vomiting.  Genitourinary: Negative.   Musculoskeletal: Positive for arthralgias and back pain.  Skin: Negative.  Negative for color change and rash.  Neurological: Negative.  Negative for weakness,  numbness and headaches.  Psychiatric/Behavioral: Negative.        Objective:    Physical Exam Vitals and nursing note reviewed.  Constitutional:      General: He is not in acute distress.    Appearance: He is well-developed.  HENT:     Head: Normocephalic and atraumatic.  Eyes:     Conjunctiva/sclera: Conjunctivae normal.     Pupils: Pupils are equal, round, and reactive to light.  Cardiovascular:     Rate and Rhythm: Normal rate and regular rhythm.  Pulmonary:     Effort: Pulmonary effort is normal. No respiratory distress.     Breath sounds: Normal breath sounds.  Musculoskeletal:     Left shoulder: Tenderness present. Decreased range of motion. Normal  strength.  Skin:    General: Skin is warm and dry.  Psychiatric:        Behavior: Behavior normal.     There were no vitals taken for this visit. Wt Readings from Last 3 Encounters:  04/02/17 208 lb (94.3 kg)  08/13/16 212 lb 6.4 oz (96.3 kg)  03/01/15 240 lb (108.9 kg)    Health Maintenance Due  Topic Date Due  . HIV Screening  04/08/2000  . TETANUS/TDAP  11/29/2010  . INFLUENZA VACCINE  09/20/2018    There are no preventive care reminders to display for this patient.   No results found for: TSH Lab Results  Component Value Date   WBC 7.3 03/01/2015   HGB 14.6 03/01/2015   HCT 43.7 03/01/2015   MCV 90.9 03/01/2015   PLT 290 03/01/2015   Lab Results  Component Value Date   NA 136 03/01/2015   K 3.3 (L) 03/01/2015   CO2 27 03/01/2015   GLUCOSE 103 (H) 03/01/2015   BUN 12 03/01/2015   CREATININE 0.98 03/01/2015   BILITOT 0.9 03/28/2009   ALKPHOS 92 03/28/2009   AST 21 03/28/2009   ALT 17 03/28/2009   PROT 7.2 03/28/2009   ALBUMIN 4.2 03/28/2009   CALCIUM 9.3 03/01/2015   ANIONGAP 6 03/01/2015   No results found for: CHOL No results found for: HDL No results found for: LDLCALC No results found for: TRIG No results found for: CHOLHDL No results found for: UPJS3P     Assessment & Plan:   Problem List Items Addressed This Visit    None    Visit Diagnoses    Acute pain of right shoulder    -  Primary   Relevant Orders   DG Shoulder Right       No orders of the defined types were placed in this encounter.    Remus Loffler, PA-C

## 2019-04-03 NOTE — Patient Instructions (Signed)
Shoulder Sprain  A shoulder sprain is a partial or complete tear in one of the tough, fiber-like tissues (ligaments) in the shoulder. The ligaments in the shoulder help to hold the shoulder in place. What are the causes? This condition may be caused by:  A fall.  A hit to the shoulder.  A twist of the arm. What increases the risk? You are more likely to develop this condition if you:  Play sports.  Have problems with balance or coordination. What are the signs or symptoms? Symptoms of this condition include:  Pain when moving the shoulder.  Limited ability to move the shoulder.  Swelling and tenderness on top of the shoulder.  Warmth in the shoulder.  A change in the shape of the shoulder.  Redness or bruising on the shoulder. How is this diagnosed? This condition is diagnosed with:  A physical exam. During the exam, you may be asked to do simple exercises with your shoulder.  Imaging tests such as X-rays, MRI, or a CT scan. These tests can show how severe the sprain is. How is this treated? This condition may be treated with:  Rest.  Pain medicine.  Ice.  A sling or brace. This is used to keep the arm still while the shoulder is healing.  Physical therapy or rehabilitation exercises. These help to improve the range of motion and strength of the shoulder.  Surgery (rare). Surgery may be needed if the sprain caused a joint to become unstable. Surgery may also be needed to reduce pain. Some people may develop ongoing shoulder pain or lose some range of motion in the shoulder. However, most people do not develop long-term problems. Follow these instructions at home:  If you have a sling or brace:  Wear the sling or brace as told by your health care provider. Remove it only as told by your health care provider.  Loosen the sling or brace if your fingers tingle, become numb, or turn cold and blue.  Keep the sling or brace clean.  If the sling or brace is not  waterproof: ? Do not let it get wet. ? Cover it with a watertight covering when you take a bath or shower. Activity  Rest your shoulder.  Move your arm only as much as told by your health care provider, but move your hand and fingers often to prevent stiffness and swelling.  Return to your normal activities as told by your health care provider. Ask your health care provider what activities are safe for you.  Ask your health care provider when it is safe for you to drive if you have a sling or brace on your shoulder.  If you were shown how to do any exercises, do them as told by your health care provider. General instructions  If directed, put ice on the affected area. ? Put ice in a plastic bag. ? Place a towel between your skin and the bag. ? Leave the ice on for 20 minutes, 2-3 times a day.  Take over-the-counter and prescription medicines only as told by your health care provider.  Do not use any products that contain nicotine or tobacco, such as cigarettes, e-cigarettes, and chewing tobacco. These can delay healing. If you need help quitting, ask your health care provider.  Keep all follow-up visits as told by your health care provider. This is important. Contact a health care provider if:  Your pain gets worse.  Your pain is not relieved with medicines.  You have increased  redness or swelling. Get help right away if:  You have a fever.  You cannot move your arm or shoulder.  You develop severe numbness or tingling in your arm, hand, or fingers.  Your arm, hand, or fingers feel cold and turn blue, white, or gray. Summary  A shoulder sprain is a partial or complete tear in one of the tough, fiber-like tissues (ligaments) in the shoulder.  This condition may be caused by a fall, a hit to the shoulder, or a twist of the arm.  Treatment usually includes rest, ice, and pain medicine as needed.  If you have a sling or brace, wear it as told by your health care  provider. Remove it only as told by your health care provider. This information is not intended to replace advice given to you by your health care provider. Make sure you discuss any questions you have with your health care provider. Document Revised: 07/12/2017 Document Reviewed: 07/12/2017 Elsevier Patient Education  2020 [employer]    

## 2019-11-10 ENCOUNTER — Ambulatory Visit (INDEPENDENT_AMBULATORY_CARE_PROVIDER_SITE_OTHER): Payer: 59 | Admitting: Nurse Practitioner

## 2019-11-10 ENCOUNTER — Telehealth: Payer: Self-pay | Admitting: Family Medicine

## 2019-11-10 ENCOUNTER — Encounter: Payer: Self-pay | Admitting: Nurse Practitioner

## 2019-11-10 ENCOUNTER — Other Ambulatory Visit: Payer: Self-pay

## 2019-11-10 VITALS — BP 140/84 | HR 76 | Temp 97.5°F | Resp 20 | Ht 69.0 in | Wt 228.0 lb

## 2019-11-10 DIAGNOSIS — R079 Chest pain, unspecified: Secondary | ICD-10-CM | POA: Diagnosis not present

## 2019-11-10 MED ORDER — PANTOPRAZOLE SODIUM 40 MG PO TBEC: 40.0000 mg | DELAYED_RELEASE_TABLET | Freq: Every day | ORAL | 3 refills | Status: DC

## 2019-11-10 MED ORDER — LANSOPRAZOLE 30 MG PO CPDR: 30.0000 mg | DELAYED_RELEASE_CAPSULE | Freq: Every day | ORAL | 3 refills | Status: DC

## 2019-11-10 NOTE — Telephone Encounter (Signed)
Pt seen MMM today--[facility] called about lansoprazole (PREVACID) 30 MG capsule--needs PA or send in something. please call back

## 2019-11-10 NOTE — Telephone Encounter (Signed)
Can you change rx to something else?

## 2019-11-10 NOTE — Patient Instructions (Signed)

## 2019-11-10 NOTE — Progress Notes (Signed)
   Subjective:    Patient ID: Dale Wiley, male    DOB: [birth year], 34 y.o.   MRN: [id number]   Chief Complaint: Chest Pain   HPI Patient comes in today c/o chest tightness for over 5 days.describes pain as a tightness. Sometimes there will be a streak of pain that runs up the middle of his chest. He has had slight SOB. Lying down increased pain and sitting up would ease pain slightly. Last 2 days the pain as been constant. rates pain 2/10 currently. Denies heart racing but has felt an occasional flutter.  Got his first covid 2 weeks ago. Grandfather had heart attack in his 32's   Review of Systems  Constitutional: Negative.   HENT: Negative.   Respiratory: Positive for chest tightness and shortness of breath. Negative for cough.   Cardiovascular: Positive for chest pain and palpitations. Negative for leg swelling.  Gastrointestinal: Negative.  Negative for nausea and vomiting.  Neurological: Negative.   Psychiatric/Behavioral: Negative.   All other systems reviewed and are negative.      Objective:   Physical Exam Vitals and nursing note reviewed.  Constitutional:      Appearance: He is well-developed.  Cardiovascular:     Rate and Rhythm: Normal rate and regular rhythm.     Heart sounds: Normal heart sounds. No friction rub. No gallop. No S3 or S4 sounds.   Pulmonary:     Breath sounds: Normal breath sounds.  Musculoskeletal:        General: Normal range of motion.  Neurological:     Mental Status: He is alert.    BP 140/84 (BP Location: Left Arm)   Pulse 76   Temp (!) 97.5 F (36.4 C) (Temporal)   Resp 20   Ht 5\' 9"  (1.753 m)   Wt 228 lb (103.4 kg)   SpO2 98%   BMI 33.67 kg/m     , FNP     Assessment & Plan:  Quentin Angst in today with chief complaint of Chest Pain   1. Chest pain, unspecified type Consulted with C. Hawks.FNP REST NO STRENOUS ACTIVITY If pain worsens go to the ER Take aspirin daily No spicy or fatty  foods - EKG 12-Lead    The above assessment and management plan was discussed with the patient. The patient verbalized understanding of and has agreed to the management plan. Patient is aware to call the clinic if symptoms persist or worsen. Patient is aware when to return to the clinic for a follow-up visit. Patient educated on when it is appropriate to go to the emergency department.   Mary-Margaret [name], FNP

## 2019-11-12 ENCOUNTER — Ambulatory Visit (INDEPENDENT_AMBULATORY_CARE_PROVIDER_SITE_OTHER): Payer: 59

## 2019-11-12 ENCOUNTER — Other Ambulatory Visit: Payer: Self-pay | Admitting: Cardiology

## 2019-11-12 ENCOUNTER — Other Ambulatory Visit (HOSPITAL_COMMUNITY)
Admission: RE | Admit: 2019-11-12 | Discharge: 2019-11-12 | Disposition: A | Discharge status: Home / Self Care | Payer: 59 | Source: Ambulatory Visit | Attending: Cardiology | Admitting: Cardiology

## 2019-11-12 ENCOUNTER — Other Ambulatory Visit: Payer: Self-pay

## 2019-11-12 ENCOUNTER — Telehealth: Payer: Self-pay | Admitting: *Deleted

## 2019-11-12 ENCOUNTER — Ambulatory Visit: Payer: 59 | Admitting: Cardiology

## 2019-11-12 ENCOUNTER — Encounter: Payer: Self-pay | Admitting: Cardiology

## 2019-11-12 VITALS — BP 124/90 | HR 84 | Ht 69.0 in | Wt 228.0 lb

## 2019-11-12 DIAGNOSIS — R079 Chest pain, unspecified: Secondary | ICD-10-CM

## 2019-11-12 DIAGNOSIS — R0602 Shortness of breath: Secondary | ICD-10-CM

## 2019-11-12 DIAGNOSIS — R072 Precordial pain: Secondary | ICD-10-CM

## 2019-11-12 LAB — BASIC METABOLIC PANEL
Anion gap: 10 (ref 5–15)
BUN: 16 mg/dL (ref 6–20)
CO2: 26 mmol/L (ref 22–32)
Calcium: 9.6 mg/dL (ref 8.9–10.3)
Chloride: 101 mmol/L (ref 98–111)
Creatinine, Ser: 0.94 mg/dL (ref 0.61–1.24)
GFR calc Af Amer: >60 mL/min (ref >60)
GFR calc non Af Amer: >60 mL/min (ref >60)
Glucose, Bld: 155 mg/dL — ABNORMAL HIGH (ref 70–99)
Potassium: 3.8 mmol/L (ref 3.5–5.1)
Sodium: 137 mmol/L (ref 135–145)

## 2019-11-12 LAB — ECHOCARDIOGRAM COMPLETE
AR max vel: 1.77 cm2
AV Peak grad: 10.6 mmHg
Ao pk vel: 1.63 m/s
Area-P 1/2: 3.83 cm2
Calc EF: 52 %
Height: 69 in
S' Lateral: 3.71 cm
Single Plane A2C EF: 50 %
Single Plane A4C EF: 51.7 %
Weight: 3648 oz

## 2019-11-12 LAB — CBC
HCT: 44.3 % (ref 39.0–52.0)
Hemoglobin: 15.1 g/dL (ref 13.0–17.0)
MCH: 31.5 pg (ref 26.0–34.0)
MCHC: 34.1 g/dL (ref 30.0–36.0)
MCV: 92.5 fL (ref 80.0–100.0)
Platelets: 245 10*3/uL (ref 150–400)
RBC: 4.79 MIL/uL (ref 4.22–5.81)
RDW: 11.9 % (ref 11.5–15.5)
WBC: 5.5 10*3/uL (ref 4.0–10.5)
nRBC: 0 % (ref 0.0–0.2)

## 2019-11-12 LAB — TROPONIN I (HIGH SENSITIVITY): Troponin I (High Sensitivity): <2 ng/L (ref <18)

## 2019-11-12 NOTE — Progress Notes (Signed)
Cardiology Office Note  Date: 11/12/2019   ID: Dale Wiley, DOB February 12, 1986, MRN [id number]  PCP:  Dettinger, Elige Radon, MD  Cardiologist:  Nona Dell, MD Electrophysiologist:  None   Chief Complaint  Patient presents with  . Chest Pain  . Shortness of Breath    History of Present Illness: Dale Wiley is a 34 y.o. male referred for cardiology consultation by Ms. Martin NP for the evaluation of chest pain.  He states that about one week ago he began to experience intermittent episodes of chest tightness, these have become more persistent/prolonged.  Symptoms are worse with activity and associated with shortness of breath.  He does not have any resting symptoms.  No associated palpitations or syncope.  He states that he had his first Moderna vaccine shot 2 weeks ago.  He was placed on Prevacid by PCP, no change in symptomatology at least in the last few days.  He has no known personal cardiac risk factors.  He does mention that his paternal uncle had myocarditis/pericarditis, was a marathon runner.  He describes no chronic medical conditions or recent acute illnesses.  I reviewed his recent ECG which is normal.  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  Past Medical History:  Diagnosis Date  . No pertinent past medical history     Past Surgical History:  Procedure Laterality Date  . WISDOM TOOTH EXTRACTION      Current Outpatient Medications  Medication Sig Dispense Refill  . lansoprazole (PREVACID) 30 MG capsule Take 1 capsule (30 mg total) by mouth daily at 12 noon. 30 capsule 3   No current facility-administered medications for this visit.   Allergies:  Penicillins and Amoxicillin   Social History: The patient  reports that he has never smoked. He has never used smokeless tobacco. He reports current alcohol use. He reports that he does not use drugs.   Family History: The patient's family history includes Hyperlipidemia in his father;  Hypertension in his father, maternal grandfather, and mother; Pancreatitis in his mother.   ROS:  No fevers or chills, no syncope.  Physical Exam: VS:  BP 124/90   Pulse 84   Ht 5\' 9"  (1.753 m)   Wt 228 lb (103.4 kg)   SpO2 94%   BMI 33.67 kg/m , BMI Body mass index is 33.67 kg/m.  Wt Readings from Last 3 Encounters:  11/12/19 228 lb (103.4 kg)  11/10/19 228 lb (103.4 kg)  04/03/19 227 lb (103 kg)    General: Patient appears comfortable at rest. HEENT: Conjunctiva and lids normal, wearing a mask. Neck: Supple, no elevated JVP or carotid bruits, no thyromegaly. Lungs: Clear to auscultation, nonlabored breathing at rest. Cardiac: Regular rate and rhythm, no S3 or significant systolic murmur, no pericardial rub. Abdomen: Soft, bowel sounds present. Extremities: No pitting edema, distal pulses 2+. Skin: Warm and dry. Musculoskeletal: No kyphosis. Neuropsychiatric: Alert and oriented x3, affect grossly appropriate.  ECG:  An ECG dated 11/10/2019 was personally reviewed today and demonstrated:  Normal sinus rhythm.  Recent Labwork:  No recent lab work available for review today.  Other Studies Reviewed Today:  No prior cardiac testing for review today.  Assessment and Plan:  Recent onset chest tightness and dyspnea on exertion in a 34 year old male with no major known cardiac risk factors.  Baseline ECG is normal.  He appears comfortable at rest, no significant cardiac murmur or rub on examination.  He did have the 1st dose of the [facility]  vaccine 2 weeks ago.  Plan is to obtain an echocardiogram today to exclude cardiomyopathy (very small incidence of myocarditis/pericarditis), also sent for lab work including high-sensitivity troponin I level, CBC, and BMET.  Further recommendations to follow.  Medication Adjustments/Labs and Tests Ordered: Current medicines are reviewed at length with the patient today.  Concerns regarding medicines are outlined above.   Tests  Ordered: Orders Placed This Encounter  Procedures  . Troponin I  . Basic Metabolic Panel (BMET)  . CBC  . ECHOCARDIOGRAM COMPLETE    Medication Changes: No orders of the defined types were placed in this encounter.   Disposition:  Follow up test results.  Signed, Jonelle Sidle, MD, [facility] 11/12/2019 9:39 AM    [facility] [address] [place], [place], [place] [address] Phone: [phone]; Fax: [phone]

## 2019-11-12 NOTE — Patient Instructions (Signed)
Your physician recommends that you schedule a follow-up appointment in: PENDING TEST RESULTS   Your physician recommends that you continue on your current medications as directed. Please refer to the Current Medication list given to you today.  Your physician has requested that you have an echocardiogram TODAY. Echocardiography is a painless test that uses sound waves to create images of your heart. It provides your doctor with information about the size and shape of your heart and how well your heart's chambers and valves are working. This procedure takes approximately one hour. There are no restrictions for this procedure.  Your physician recommends that you return for lab work TODAY CBC/BMP/TROPONIN   Thank you for choosing [facility]!!

## 2019-11-12 NOTE — Telephone Encounter (Signed)
Results reviewed. Cardiac enzyme level is normal which is reassuring. Additional lab work shows normal renal function and hemoglobin. His glucose is elevated, but this was not necessarily a fasting study. Would make sure that he follows up with PCP more closely in terms of his glucose level over time to make sure that he does not have diabetes.     Pt voiced understanding - will placed orders for GXT and forward to schedulers

## 2019-11-12 NOTE — Telephone Encounter (Signed)
-----   Message from Jonelle Sidle, MD sent at 11/12/2019  1:59 PM EDT ----- Results reviewed.  Please let him know that the echocardiogram shows low normal LVEF at 50 to 55%, overall reassuring findings that would not explain his symptoms.  Let's go ahead and schedule a GXT as a next step, please get this set up as soon as possible.

## 2019-11-13 ENCOUNTER — Telehealth: Payer: Self-pay | Admitting: *Deleted

## 2019-11-13 ENCOUNTER — Ambulatory Visit: Payer: 59 | Admitting: Internal Medicine

## 2019-11-13 ENCOUNTER — Telehealth: Payer: Self-pay | Admitting: Cardiology

## 2019-11-13 NOTE — Telephone Encounter (Signed)
-----   Message from Megan Salon sent at 11/13/2019  9:58 AM EDT ----- Scheduled for 9/29 needs to arrive at 9am to register. Go to [employer].  ----- Message ----- From: Albertine Patricia, CMA Sent: 11/12/2019   3:37 PM EDT To: Megan Salon  Can you please schedule GXT for this pt asap, I can call him and he will have to have covid test too so let me know when GXT scheduled, thank you    [facility]

## 2019-11-13 NOTE — Telephone Encounter (Signed)
Pre-cert Verification for the following procedure    GXT   DATE: 11/18/2019  LOCATION:  [facility]

## 2019-11-13 NOTE — Telephone Encounter (Signed)
Pt aware and scheduled covid test for 9/27 @ 350pm - pt voiced understanding of instructions

## 2019-11-16 ENCOUNTER — Other Ambulatory Visit: Payer: Self-pay

## 2019-11-16 ENCOUNTER — Telehealth: Payer: Self-pay | Admitting: Cardiology

## 2019-11-16 ENCOUNTER — Other Ambulatory Visit (HOSPITAL_COMMUNITY)
Admission: RE | Admit: 2019-11-16 | Discharge: 2019-11-16 | Disposition: A | Discharge status: Home / Self Care | Payer: 59 | Source: Ambulatory Visit | Attending: Cardiology | Admitting: Cardiology

## 2019-11-16 NOTE — Telephone Encounter (Signed)
Patient called stating that he is feeling better . Wants to cancel his COVID testing and stress test.  Patient wants to make certain that Dr. Diona Browner is ok with this.

## 2019-11-16 NOTE — Telephone Encounter (Signed)
Reports taking lansoprazole 30 mg daily and says that he no longer has chest pain, sob or dizziness.  Patient informed that without testing we are unable to determine if symptoms were caused by heart Patient agrees to rescheduling stress test and covid test.

## 2019-11-17 ENCOUNTER — Telehealth: Payer: Self-pay | Admitting: Cardiology

## 2019-11-17 NOTE — Telephone Encounter (Signed)
Pt aware that covid test scheduled for 10/1 @ 355pm

## 2019-11-17 NOTE — Telephone Encounter (Signed)
GXT has been re-scheduled for 11-23-2019  Patient needs to have COVID test scheduled. He requested Friday afternoon late if possible.

## 2019-11-18 ENCOUNTER — Ambulatory Visit (HOSPITAL_COMMUNITY): Payer: 59

## 2019-11-20 ENCOUNTER — Other Ambulatory Visit (HOSPITAL_COMMUNITY)
Admission: RE | Admit: 2019-11-20 | Discharge: 2019-11-20 | Disposition: A | Discharge status: Home / Self Care | Payer: 59 | Source: Ambulatory Visit | Attending: Cardiology | Admitting: Cardiology

## 2019-11-20 ENCOUNTER — Other Ambulatory Visit: Payer: Self-pay

## 2019-11-20 DIAGNOSIS — Z01812 Encounter for preprocedural laboratory examination: Secondary | ICD-10-CM | POA: Insufficient documentation

## 2019-11-20 DIAGNOSIS — Z20822 Contact with and (suspected) exposure to covid-19: Secondary | ICD-10-CM | POA: Diagnosis not present

## 2019-11-21 LAB — SARS CORONAVIRUS 2 (TAT 6-24 HRS): SARS Coronavirus 2: NEGATIVE

## 2019-11-23 ENCOUNTER — Other Ambulatory Visit: Payer: Self-pay

## 2019-11-23 ENCOUNTER — Ambulatory Visit (HOSPITAL_COMMUNITY)
Admission: RE | Admit: 2019-11-23 | Discharge: 2019-11-23 | Disposition: A | Discharge status: Home / Self Care | Payer: 59 | Source: Ambulatory Visit | Attending: Cardiology | Admitting: Cardiology

## 2019-11-23 DIAGNOSIS — R072 Precordial pain: Secondary | ICD-10-CM

## 2019-11-23 LAB — EXERCISE TOLERANCE TEST
Estimated workload: 12.6 METS
Exercise duration (min): 9 min
Exercise duration (sec): 47 s
MPHR: 186 {beats}/min
Peak HR: 176 {beats}/min
Percent HR: 94 %
RPE: 13
Rest HR: 76 {beats}/min

## 2019-11-24 ENCOUNTER — Telehealth: Payer: Self-pay | Admitting: *Deleted

## 2019-11-24 NOTE — Telephone Encounter (Signed)
-----   Message from Jonelle Sidle, MD sent at 11/23/2019  4:37 PM EDT ----- Results reviewed.  Please let him know that the stress test looked good, low risk for cardiac events and no active ischemia to suggest obstructive CAD as cause of his symptoms.  With normal LVEF and high-sensitivity troponin I levels also doubt myocarditis.  Hopefully he is starting to feel better.  Would keep an eye on symptoms and follow-up with PCP.

## 2019-11-26 NOTE — Telephone Encounter (Signed)
Patient informed. Copy sent to PCP °

## 2019-12-29 ENCOUNTER — Telehealth: Payer: Self-pay

## 2019-12-29 NOTE — Telephone Encounter (Signed)
Pt aware that he is due by VM

## 2019-12-30 ENCOUNTER — Ambulatory Visit (INDEPENDENT_AMBULATORY_CARE_PROVIDER_SITE_OTHER): Payer: 59

## 2019-12-30 ENCOUNTER — Other Ambulatory Visit: Payer: Self-pay

## 2019-12-30 DIAGNOSIS — Z23 Encounter for immunization: Secondary | ICD-10-CM | POA: Diagnosis not present

## 2021-06-15 ENCOUNTER — Ambulatory Visit: Payer: 59 | Admitting: Family Medicine

## 2021-06-15 ENCOUNTER — Encounter: Payer: Self-pay | Admitting: Family Medicine

## 2021-06-15 ENCOUNTER — Ambulatory Visit (INDEPENDENT_AMBULATORY_CARE_PROVIDER_SITE_OTHER): Payer: 59

## 2021-06-15 VITALS — BP 126/73 | HR 73 | Temp 98.0°F | Ht 69.0 in | Wt 218.1 lb

## 2021-06-15 DIAGNOSIS — M545 Low back pain, unspecified: Secondary | ICD-10-CM

## 2021-06-15 MED ORDER — METHYLPREDNISOLONE ACETATE 40 MG/ML IJ SUSP
40.0000 mg | Freq: Once | INTRAMUSCULAR | Status: AC
  Administered 2021-06-15: 80 mg via INTRAMUSCULAR

## 2021-06-15 MED ORDER — TIZANIDINE HCL 4 MG PO TABS: 4.0000 mg | ORAL_TABLET | Freq: Four times a day (QID) | ORAL | 0 refills | Status: DC | PRN

## 2021-06-15 MED ORDER — MELOXICAM 15 MG PO TABS: 15.0000 mg | ORAL_TABLET | Freq: Every day | ORAL | 0 refills | Status: DC

## 2021-06-15 MED ORDER — METHYLPREDNISOLONE ACETATE 80 MG/ML IJ SUSP: 80.0000 mg | Freq: Once | INTRAMUSCULAR | Status: DC

## 2021-06-15 NOTE — Progress Notes (Signed)
? ?  Acute Office Visit ? ?Subjective:  ? ?  ?Patient ID: Dale Wiley, male    DOB: 10/17/1985, 36 y.o.   MRN: [id number] ? ?Chief Complaint  ?Patient presents with  ? Back Pain  ? ? ?HPI ?Patient is in today for lower left sided back pain. The pain started 5-6 weeks ago when weight lifting. He tried rest, ice, heat, and ibuprofen. Pain has improved some but is still present. It is mild to moderated. The is constant and achy and intermittent sharp pain with movement, twisting, or bending. It feels like it catches sometimes. The pain is also worse when sitting. He denies changes in bowel or bladder control, numbness, tingling, decreased ROM, fever, or injury.  ? ? ? ?ROS ?As per HPI. ? ?   ?Objective:  ?  ?BP 126/73   Pulse 73   Temp 98 ?F (36.7 ?C) (Temporal)   Ht 5\' 9"  (1.753 m)   Wt 218 lb 2 oz (98.9 kg)   BMI 32.21 kg/m?  ? ? ?Physical Exam ?Vitals and nursing note reviewed.  ?Constitutional:   ?   General: He is not in acute distress. ?   Appearance: He is not ill-appearing, toxic-appearing or diaphoretic.  ?Pulmonary:  ?   Effort: Pulmonary effort is normal. No respiratory distress.  ?Musculoskeletal:  ?   Lumbar back: Tenderness (left paraspinal) and bony tenderness present. No swelling, edema, deformity or signs of trauma. Normal range of motion. Negative right straight leg raise test and negative left straight leg raise test.  ?   Right lower leg: No edema.  ?   Left lower leg: No edema.  ?Skin: ?   General: Skin is warm and dry.  ?Neurological:  ?   General: No focal deficit present.  ?   Mental Status: He is alert and oriented to person, place, and time.  ?   Motor: No weakness.  ?   Gait: Gait normal.  ?Psychiatric:     ?   Mood and Affect: Mood normal.  ? ? ?No results found for any visits on 06/15/21. ? ? ?   ?Assessment & Plan:  ? ?Ruhaan was seen today for back pain. ? ?Diagnoses and all orders for this visit: ? ?Acute midline low back pain without sciatica ?Xray reports pending. Mobic ordered, do  not take other NSAIDs with mobic. Zanaflex prn. Steroid IM injection today in office. Declined PT referral today, patient will do exercises and stretching at home.  ?-     DG Lumbar Spine 2-3 Views; Future ?-     meloxicam (MOBIC) 15 MG tablet; Take 1 tablet (15 mg total) by mouth daily. ?-     tiZANidine (ZANAFLEX) 4 MG tablet; Take 1 tablet (4 mg total) by mouth every 6 (six) hours as needed for muscle spasms. ?-     methylPREDNISolone acetate (DEPO-MEDROL) injection 40 mg ? ?Return if symptoms worsen or fail to improve. ? ?The patient indicates understanding of these issues and agrees with the plan. ? ?Onalee Hua, FNP ? ? ?

## 2021-06-15 NOTE — Patient Instructions (Signed)

## 2021-06-30 ENCOUNTER — Telehealth: Payer: Self-pay | Admitting: Family Medicine

## 2021-06-30 NOTE — Telephone Encounter (Signed)
He will need to continue to do therapy and exercises at home every day, the ones they taught or do physical therapy referral and then there will need to a past 6 weeks from his initial visit and he will need to come in for a visit at least 6 weeks from that initial visit having already done the physical therapy and then we can order an MRI.  Insurance will not approve it if we do not do the steps.  Have him make an appointment for sometime around that time making sure its been at least 6 weeks from his initial visit and make sure he is doing either therapy at home or physical therapy, looks like he declined that initially ?

## 2021-06-30 NOTE — Telephone Encounter (Signed)
Pt seen 06/15/2021 for back pain. He was doing good till yesterday and today. The lower back pain is back. He was told to call back if any more issues. He wants to know if an MRI can be scheduled. Please call back ?

## 2021-07-04 NOTE — Telephone Encounter (Signed)
Left message to return call 

## 2021-07-05 NOTE — Telephone Encounter (Signed)
Patient return call. ?

## 2021-07-06 NOTE — Telephone Encounter (Signed)
Left VM to CB

## 2021-08-01 NOTE — Telephone Encounter (Signed)
Contacted patient.  Home exercise program has helped immensely.  Patient no longer feels MRI is necessary.  He will call  if need arises

## 2022-05-21 ENCOUNTER — Telehealth: Payer: 59 | Admitting: Family

## 2022-05-21 NOTE — Progress Notes (Signed)
PT went to Urgent Care and does not need appointment.  Evelina Dun, FNP

## 2022-08-14 ENCOUNTER — Encounter: Payer: Self-pay | Admitting: Family

## 2022-08-14 ENCOUNTER — Ambulatory Visit: Payer: 59 | Admitting: Family

## 2022-08-14 ENCOUNTER — Ambulatory Visit (INDEPENDENT_AMBULATORY_CARE_PROVIDER_SITE_OTHER): Payer: 59

## 2022-08-14 VITALS — BP 134/84 | Temp 97.2°F | Ht 69.0 in | Wt 213.4 lb

## 2022-08-14 DIAGNOSIS — M545 Low back pain, unspecified: Secondary | ICD-10-CM

## 2022-08-14 MED ORDER — DICLOFENAC SODIUM 75 MG PO TBEC: 75.0000 mg | DELAYED_RELEASE_TABLET | Freq: Two times a day (BID) | ORAL | 0 refills | Status: DC

## 2022-08-14 MED ORDER — BACLOFEN 10 MG PO TABS: 10.0000 mg | ORAL_TABLET | Freq: Three times a day (TID) | ORAL | 0 refills | Status: DC

## 2022-08-14 NOTE — Progress Notes (Signed)
Subjective:    Patient ID: Dale Wiley, male    DOB: 1986/01/10, 37 y.o.   MRN: [id number]  Chief Complaint  Patient presents with   Back Pain    Lower back pain x 2 months    PT presents to the office today with lumbar pain that started about a year ago after lifting weights. He reports the pain comes and goes. He reports he works out 4-5 days a week. Reports this last flare up has lasted for over a month.  Back Pain This is a new problem. The current episode started more than 1 month ago. The problem occurs intermittently. The problem has been waxing and waning since onset. The pain is present in the lumbar spine. The quality of the pain is described as aching. The pain is at a severity of 6/10. The pain is moderate. The symptoms are aggravated by bending. He has tried NSAIDs, home exercises and ice for the symptoms. The treatment provided mild relief.      Review of Systems  Musculoskeletal:  Positive for back pain.  All other systems reviewed and are negative.      Objective:   Physical Exam Vitals reviewed.  Constitutional:      General: He is not in acute distress.    Appearance: He is well-developed.  HENT:     Head: Normocephalic.  Eyes:     General:        Right eye: No discharge.        Left eye: No discharge.     Pupils: Pupils are equal, round, and reactive to light.  Neck:     Thyroid: No thyromegaly.  Cardiovascular:     Rate and Rhythm: Normal rate and regular rhythm.     Heart sounds: Normal heart sounds. No murmur heard. Pulmonary:     Effort: Pulmonary effort is normal. No respiratory distress.     Breath sounds: Normal breath sounds. No stridor. No wheezing.  Abdominal:     General: Bowel sounds are normal. There is no distension.     Palpations: Abdomen is soft.     Tenderness: There is no abdominal tenderness.  Musculoskeletal:        General: No tenderness. Normal range of motion.       Arms:     Cervical back: Normal range of motion and  neck supple.     Comments: Full ROM of lumbar, pain in lumbar with palpation   Skin:    General: Skin is warm and dry.     Findings: No erythema or rash.  Neurological:     Mental Status: He is alert and oriented to person, place, and time.     Cranial Nerves: No cranial nerve deficit.     Deep Tendon Reflexes: Reflexes are normal and symmetric.  Psychiatric:        Behavior: Behavior normal.        Thought Content: Thought content normal.        Judgment: Judgment normal.       BP 134/84   Temp (!) 97.2 F (36.2 C) (Temporal)   Ht 5\' 9"  (1.753 m)   Wt 213 lb 6.4 oz (96.8 kg)   SpO2 99%   BMI 31.51 kg/m      Assessment & Plan:  [name] comes in today with chief complaint of Back Pain (Lower back pain x 1 month )   Diagnosis and orders addressed:  1. Lumbar back pain Rest ROM exercises dicussed,  handout given  Start diclofenac BID with food  No other NSAID's  Baclofen as needed, sedation precautions  Follow up if symptoms worsen or do not improve  - DG Lumbar Spine 2-3 Views - diclofenac (VOLTAREN) 75 MG EC tablet; Take 1 tablet (75 mg total) by mouth 2 (two) times daily.  Dispense: 60 tablet; Refill: 0 - baclofen (LIORESAL) 10 MG tablet; Take 1 tablet (10 mg total) by mouth 3 (three) times daily.  Dispense: 30 each; Refill: 0    Jannifer Rodney, FNP

## 2022-08-14 NOTE — Patient Instructions (Signed)
Low Back Sprain or Strain Rehab Ask your health care provider which exercises are safe for you. Do exercises exactly as told by your health care provider and adjust them as directed. It is normal to feel mild stretching, pulling, tightness, or discomfort as you do these exercises. Stop right away if you feel sudden pain or your pain gets worse. Do not begin these exercises until told by your health care provider. Stretching and range-of-motion exercises These exercises warm up your muscles and joints and improve the movement and flexibility of your back. These exercises also help to relieve pain, numbness, and tingling. Lumbar rotation  Lie on your back on a firm bed or the floor with your knees bent. Straighten your arms out to your sides so each arm forms a 90-degree angle (right angle) with a side of your body. Slowly move (rotate) both of your knees to one side of your body until you feel a stretch in your lower back (lumbar). Try not to let your shoulders lift off the floor. Hold this position for __________ seconds. Tense your abdominal muscles and slowly move your knees back to the starting position. Repeat this exercise on the other side of your body. Repeat __________ times. Complete this exercise __________ times a day. Single knee to chest  Lie on your back on a firm bed or the floor with both legs straight. Bend one of your knees. Use your hands to move your knee up toward your chest until you feel a gentle stretch in your lower back and buttock. Hold your leg in this position by holding on to the front of your knee. Keep your other leg as straight as possible. Hold this position for __________ seconds. Slowly return to the starting position. Repeat with your other leg. Repeat __________ times. Complete this exercise __________ times a day. Prone extension on elbows  Lie on your abdomen on a firm bed or the floor (prone position). Prop yourself up on your elbows. Use your arms  to help lift your chest up until you feel a gentle stretch in your abdomen and your lower back. This will place some of your body weight on your elbows. If this is uncomfortable, try stacking pillows under your chest. Your hips should stay down, against the surface that you are lying on. Keep your hip and back muscles relaxed. Hold this position for __________ seconds. Slowly relax your upper body and return to the starting position. Repeat __________ times. Complete this exercise __________ times a day. Strengthening exercises These exercises build strength and endurance in your back. Endurance is the ability to use your muscles for a long time, even after they get tired. Pelvic tilt This exercise strengthens the muscles that lie deep in the abdomen. Lie on your back on a firm bed or the floor with your legs extended. Bend your knees so they are pointing toward the ceiling and your feet are flat on the floor. Tighten your lower abdominal muscles to press your lower back against the floor. This motion will tilt your pelvis so your tailbone points up toward the ceiling instead of pointing to your feet or the floor. To help with this exercise, you may place a small towel under your lower back and try to push your back into the towel. Hold this position for __________ seconds. Let your muscles relax completely before you repeat this exercise. Repeat __________ times. Complete this exercise __________ times a day. Alternating arm and leg raises  Get on your hands   and knees on a firm surface. If you are on a hard floor, you may want to use padding, such as an exercise mat, to cushion your knees. Line up your arms and legs. Your hands should be directly below your shoulders, and your knees should be directly below your hips. Lift your left leg behind you. At the same time, raise your right arm and straighten it in front of you. Do not lift your leg higher than your hip. Do not lift your arm higher  than your shoulder. Keep your abdominal and back muscles tight. Keep your hips facing the ground. Do not arch your back. Keep your balance carefully, and do not hold your breath. Hold this position for __________ seconds. Slowly return to the starting position. Repeat with your right leg and your left arm. Repeat __________ times. Complete this exercise __________ times a day. Abdominal set with straight leg raise  Lie on your back on a firm bed or the floor. Bend one of your knees and keep your other leg straight. Tense your abdominal muscles and lift your straight leg up, 4-6 inches (10-15 cm) off the ground. Keep your abdominal muscles tight and hold this position for __________ seconds. Do not hold your breath. Do not arch your back. Keep it flat against the ground. Keep your abdominal muscles tense as you slowly lower your leg back to the starting position. Repeat with your other leg. Repeat __________ times. Complete this exercise __________ times a day. Single leg lower with bent knees Lie on your back on a firm bed or the floor. Tense your abdominal muscles and lift your feet off the floor, one foot at a time, so your knees and hips are bent in 90-degree angles (right angles). Your knees should be over your hips and your lower legs should be parallel to the floor. Keeping your abdominal muscles tense and your knee bent, slowly lower one of your legs so your toe touches the ground. Lift your leg back up to return to the starting position. Do not hold your breath. Do not let your back arch. Keep your back flat against the ground. Repeat with your other leg. Repeat __________ times. Complete this exercise __________ times a day. Posture and body mechanics Good posture and healthy body mechanics can help to relieve stress in your body's tissues and joints. Body mechanics refers to the movements and positions of your body while you do your daily activities. Posture is part of body  mechanics. Good posture means: Your spine is in its natural S-curve position (neutral). Your shoulders are pulled back slightly. Your head is not tipped forward (neutral). Follow these guidelines to improve your posture and body mechanics in your everyday activities. Standing  When standing, keep your spine neutral and your feet about hip-width apart. Keep a slight bend in your knees. Your ears, shoulders, and hips should line up. When you do a task in which you stand in one place for a long time, place one foot up on a stable object that is 2-4 inches (5-10 cm) high, such as a footstool. This helps keep your spine neutral. Sitting  When sitting, keep your spine neutral and keep your feet flat on the floor. Use a footrest, if necessary, and keep your thighs parallel to the floor. Avoid rounding your shoulders, and avoid tilting your head forward. When working at a desk or a computer, keep your desk at a height where your hands are slightly lower than your elbows. Slide your   chair under your desk so you are close enough to maintain good posture. When working at a computer, place your monitor at a height where you are looking straight ahead and you do not have to tilt your head forward or downward to look at the screen. Resting When lying down and resting, avoid positions that are most painful for you. If you have pain with activities such as sitting, bending, stooping, or squatting, lie in a position in which your body does not bend very much. For example, avoid curling up on your side with your arms and knees near your chest (fetal position). If you have pain with activities such as standing for a long time or reaching with your arms, lie with your spine in a neutral position and bend your knees slightly. Try the following positions: Lying on your side with a pillow between your knees. Lying on your back with a pillow under your knees. Lifting  When lifting objects, keep your feet at least  shoulder-width apart and tighten your abdominal muscles. Bend your knees and hips and keep your spine neutral. It is important to lift using the strength of your legs, not your back. Do not lock your knees straight out. Always ask for help to lift heavy or awkward objects. This information is not intended to replace advice given to you by your health care provider. Make sure you discuss any questions you have with your health care provider. Document Revised: 04/25/2020 Document Reviewed: 04/25/2020 [employer] Patient Education  2024 [employer]  

## 2022-10-23 ENCOUNTER — Encounter: Payer: Self-pay | Admitting: Nurse Practitioner

## 2022-10-23 ENCOUNTER — Ambulatory Visit: Payer: 59 | Admitting: Nurse Practitioner

## 2022-10-23 VITALS — BP 120/77 | HR 78 | Temp 97.8°F | Ht 69.0 in | Wt 206.8 lb

## 2022-10-23 DIAGNOSIS — R051 Acute cough: Secondary | ICD-10-CM | POA: Diagnosis not present

## 2022-10-23 DIAGNOSIS — R0981 Nasal congestion: Secondary | ICD-10-CM | POA: Insufficient documentation

## 2022-10-23 DIAGNOSIS — J3489 Other specified disorders of nose and nasal sinuses: Secondary | ICD-10-CM | POA: Diagnosis not present

## 2022-10-23 MED ORDER — BENZONATATE 100 MG PO CAPS: 100.0000 mg | ORAL_CAPSULE | Freq: Two times a day (BID) | ORAL | 0 refills | Status: AC | PRN

## 2022-10-23 MED ORDER — FLUTICASONE PROPIONATE 50 MCG/ACT NA SUSP: 2.0000 | Freq: Every day | NASAL | 6 refills | Status: AC

## 2022-10-23 NOTE — Progress Notes (Signed)
Acute Office Visit  Subjective:   Dale Wiley is a 37 y.o. male who complains of congestion, sore throat, and dry cough for 6 days. He denies a history of chest pain, chills, fevers, vomiting, wheezing, and sputum production and denies a history of asthma. Patient denies smoke cigarettes. Had a home COVID test 2-days ago that was negative.   Active Ambulatory Problems    Diagnosis Date Noted   Nasal congestion with rhinorrhea 10/23/2022   Acute cough 10/23/2022   Resolved Ambulatory Problems    Diagnosis Date Noted   No Resolved Ambulatory Problems   Past Medical History:  Diagnosis Date   No pertinent past medical history       Patient ID: Dale Wiley, male    DOB: 24-Oct-1985, 37 y.o.   MRN: [id number]  Chief Complaint  Patient presents with   Sore Throat    Symptoms started about a week ago   Cough   Nasal Congestion    Review of Systems  Constitutional:  Negative for chills and fever.  HENT:  Positive for congestion and sore throat. Negative for tinnitus.   Respiratory:  Positive for cough. Negative for wheezing.        Non productive  Gastrointestinal:  Negative for blood in stool, diarrhea, heartburn and nausea.  Musculoskeletal:  Negative for falls and myalgias.  Neurological:  Negative for dizziness and headaches.  Endo/Heme/Allergies:  Negative for environmental allergies and polydipsia. Does not bruise/bleed easily.  Psychiatric/Behavioral:  Negative for hallucinations. The patient does not have insomnia.    Negative unless indicated in HPI    Objective:    BP 120/77   Pulse 78   Temp 97.8 F (36.6 C) (Temporal)   Ht 5\' 9"  (1.753 m)   Wt 206 lb 12.8 oz (93.8 kg)   SpO2 98%   BMI 30.54 kg/m  BP Readings from Last 3 Encounters:  10/23/22 120/77  08/14/22 134/84  06/15/21 126/73   Wt Readings from Last 3 Encounters:  10/23/22 206 lb 12.8 oz (93.8 kg)  08/14/22 213 lb 6.4 oz (96.8 kg)  06/15/21 218 lb 2 oz (98.9 kg)      Physical  Exam Constitutional:      General: He is not in acute distress.    Appearance: He is well-developed. He is not ill-appearing.  HENT:     Head: Normocephalic and atraumatic.     Right Ear: Tympanic membrane, ear canal and external ear normal.     Left Ear: Tympanic membrane, ear canal and external ear normal.     Nose: Congestion and rhinorrhea present.     Mouth/Throat:     Pharynx: No oropharyngeal exudate or posterior oropharyngeal erythema.  Eyes:     General: No scleral icterus.    Extraocular Movements: Extraocular movements intact.     Conjunctiva/sclera: Conjunctivae normal.     Pupils: Pupils are equal, round, and reactive to light.  Cardiovascular:     Rate and Rhythm: Normal rate and regular rhythm.  Pulmonary:     Effort: Pulmonary effort is normal.     Breath sounds: Normal breath sounds.  Musculoskeletal:        General: Normal range of motion.     Right lower leg: No edema.     Left lower leg: No edema.  Skin:    General: Skin is warm and dry.     Coloration: Skin is not jaundiced.     Findings: No rash.  Neurological:     Mental Status:  He is alert and oriented to person, place, and time. Mental status is at baseline.  Psychiatric:        Mood and Affect: Mood normal.        Behavior: Behavior normal.        Thought Content: Thought content normal.     No results found for any visits on 10/23/22.      Assessment & Plan:  Nasal congestion with rhinorrhea -     Fluticasone Propionate; Place 2 sprays into both nostrils daily.  Dispense: 16 g; Refill: 6  Acute cough -     Benzonatate; Take 1 capsule (100 mg total) by mouth 2 (two) times daily as needed for cough.  Dispense: 30 capsule; Refill: 0  Gabiel is a 37 yrs old Caucasian male, no acute distress viral upper respiratory illness Tessalon Pearls BID, in crease hydration Flonase at bedtime  return office visit prn if symptoms persist or worsen  Lack of antibiotic effectiveness discussed with him.  Call or return to clinic prn if these symptoms worsen or fail to improve as anticipated.    The above assessment and management plan was discussed with the patient. The patient verbalized understanding of and has agreed to the management plan. Patient is aware to call the clinic if they develop any new symptoms or if symptoms persist or worsen. Patient is aware when to return to the clinic for a follow-up visit. Patient educated on when it is appropriate to go to the emergency department.    Return if symptoms worsen or fail to improve.   Arrie Aran Santa Lighter, DNP [facility] [address] [place], [place] [address] [phone]

## 2023-06-12 IMAGING — DX DG LUMBAR SPINE 2-3V
3 series · 3 of 3 positions shown · non-contrast
Comparison: No prior lumbar spine radiographs, correlation is made
with CT abdomen pelvis 03/28/2009.

CLINICAL DATA: Acute lower back pain.

EXAM:
LUMBAR SPINE - 2-3 VIEW

[l-spine ap]
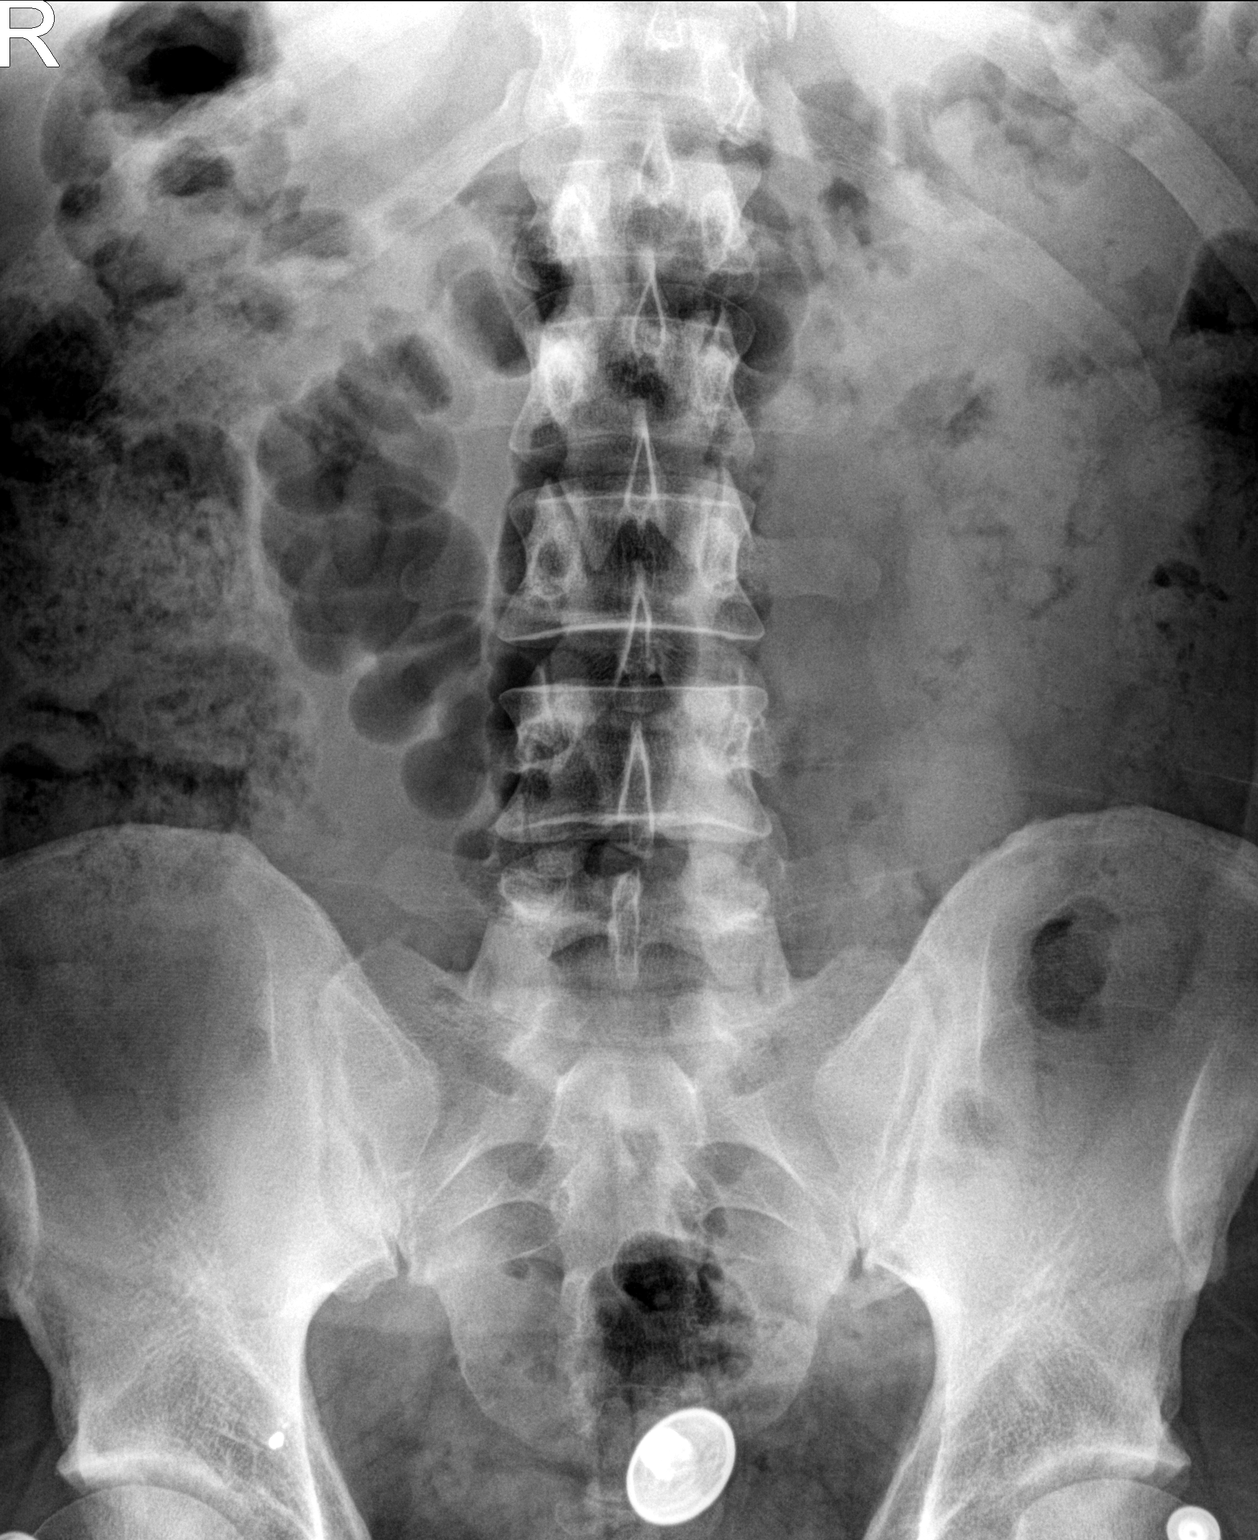

[l-spine lat (1 of 2)]
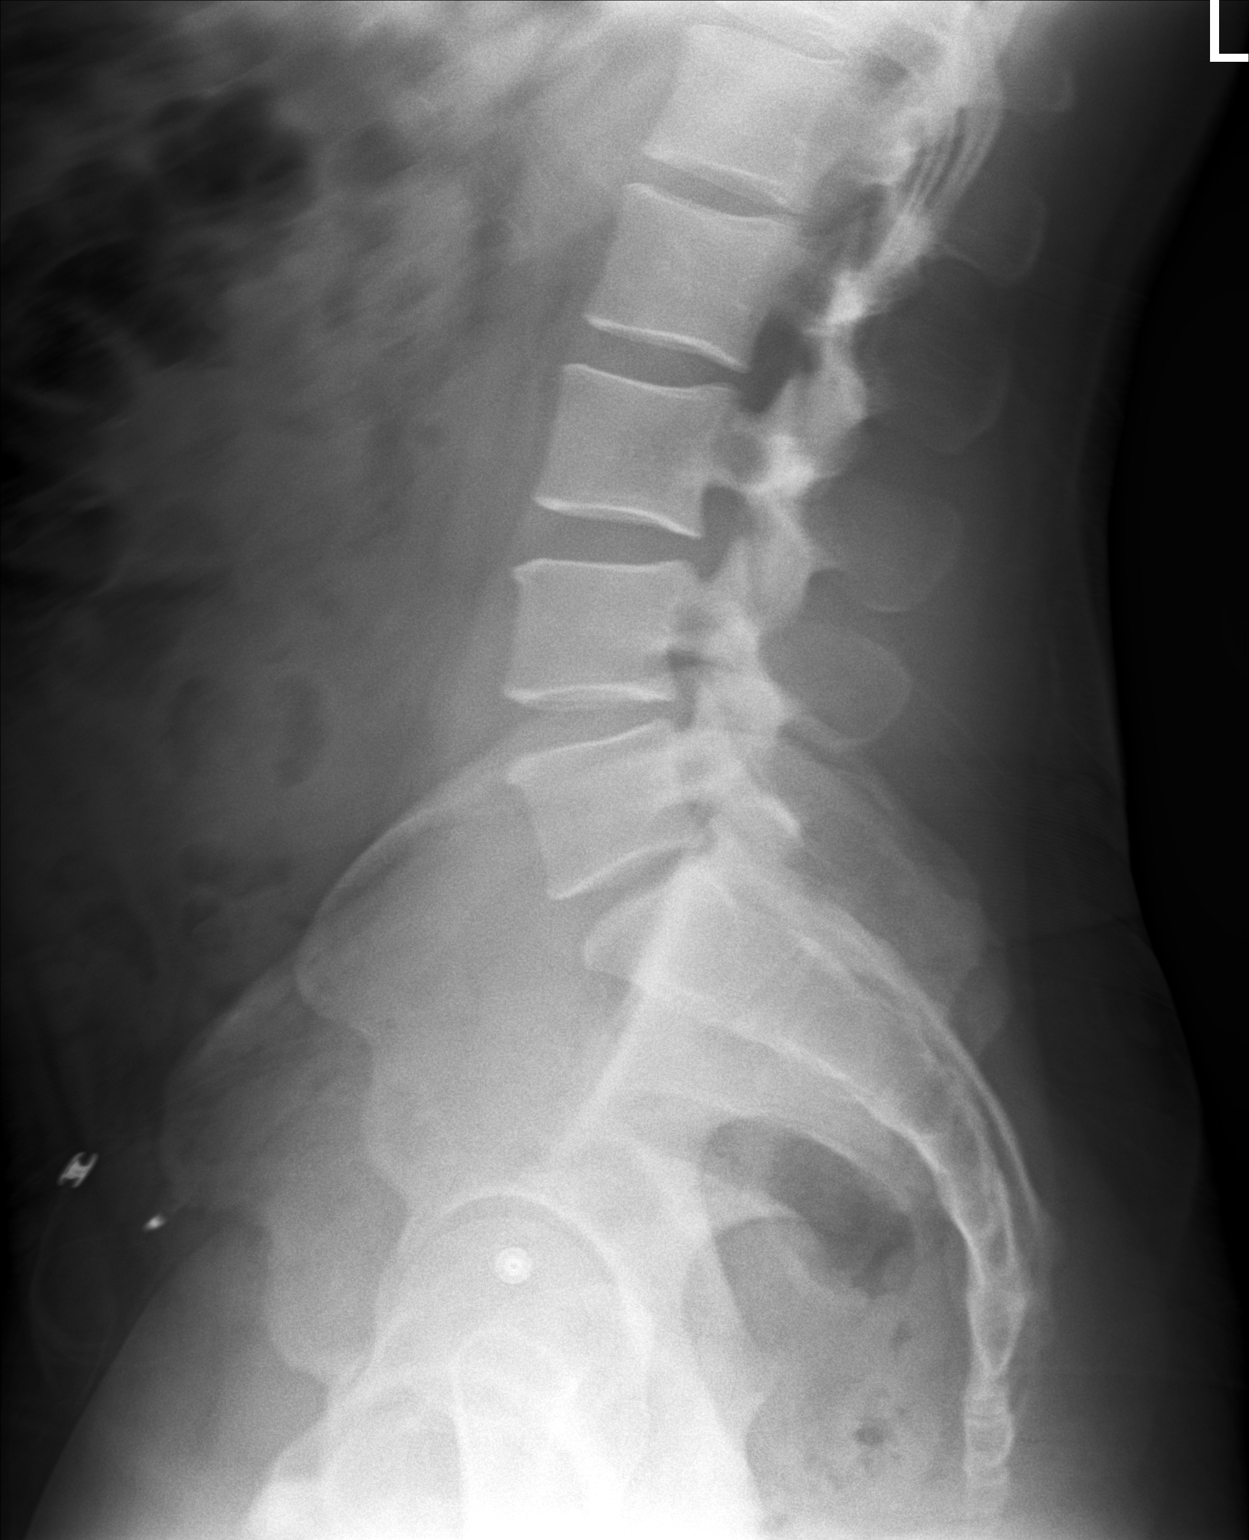

[l-spine lat (2 of 2)]
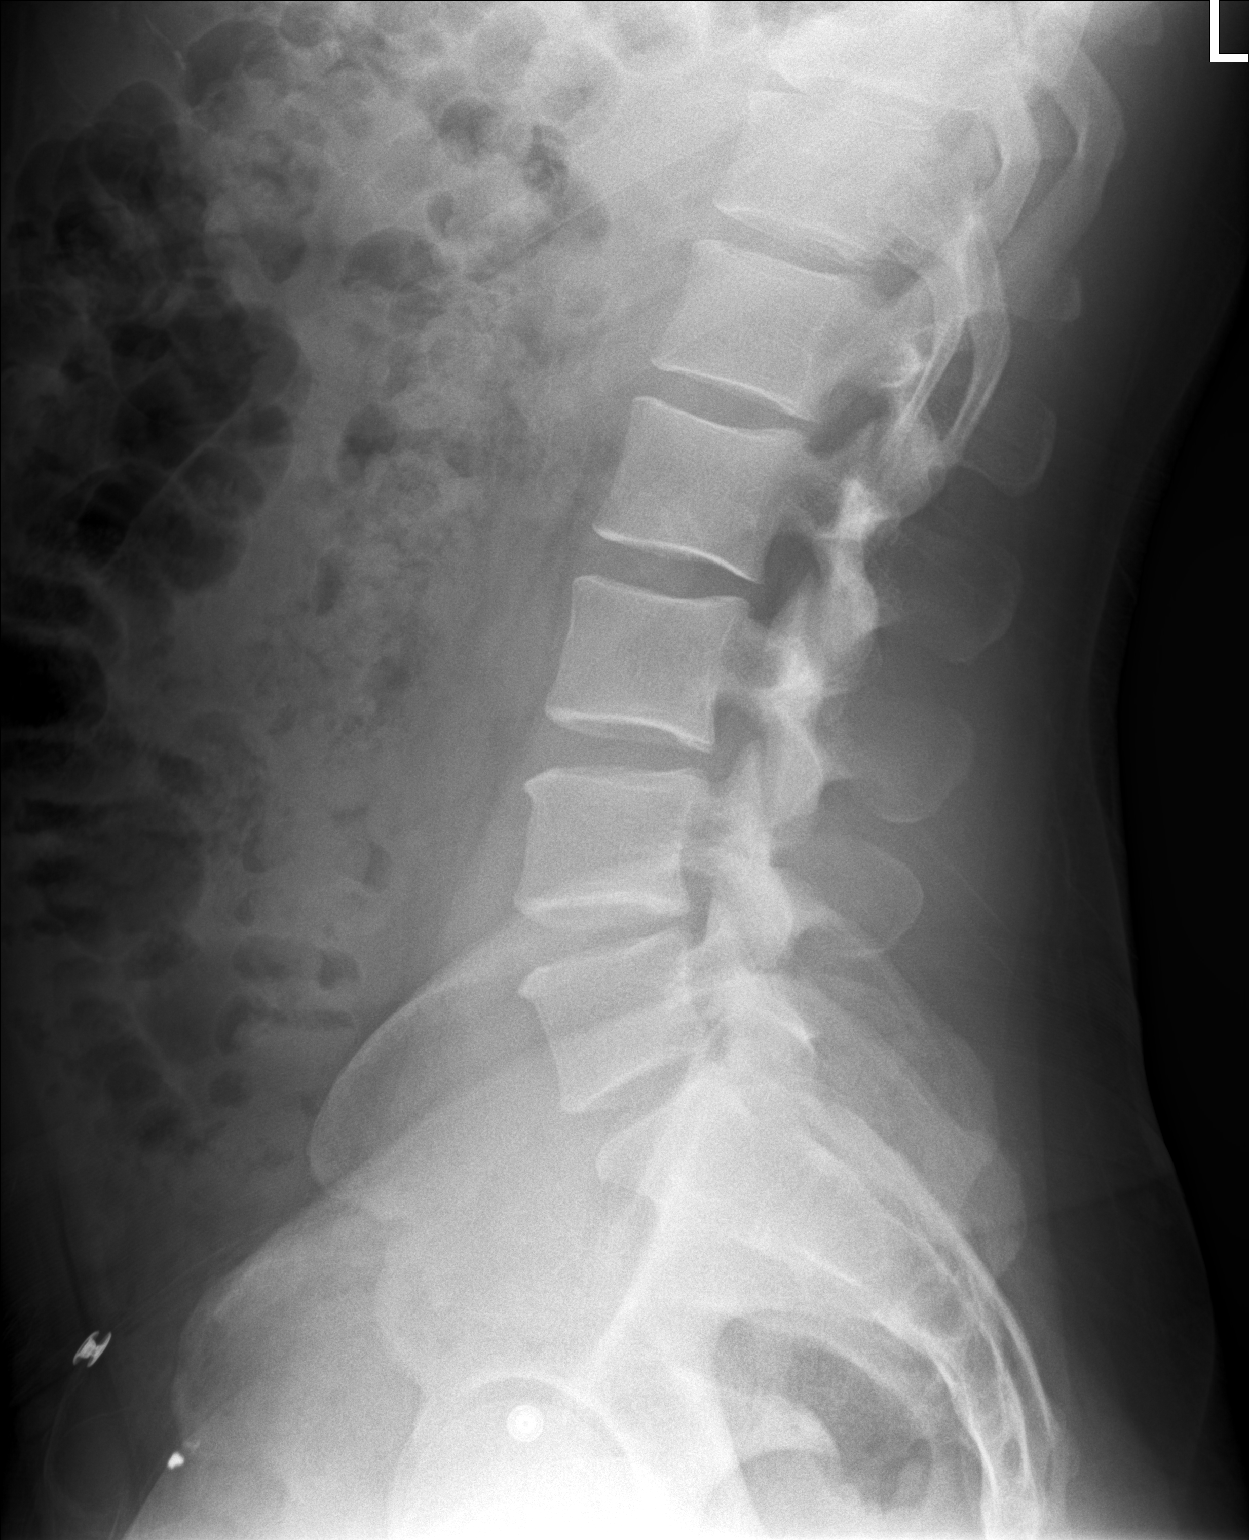

[3 of 3 positions shown; findings below may reference images not displayed]

FINDINGS: There is no evidence of lumbar spine fracture. No listhesis.
Intervertebral disc spaces are maintained.
IMPRESSION: Negative.
